# Patient Record
Sex: Female | Born: 1975 | Race: White | Hispanic: No | Marital: Married | Smoking: Current some day smoker
Health system: Southern US, Community
[De-identification: ages and names within clinical notes are randomized; demographics above are authoritative.]

## PROBLEM LIST (undated history)

## (undated) DIAGNOSIS — M1990 Unspecified osteoarthritis, unspecified site: Secondary | ICD-10-CM

## (undated) DIAGNOSIS — C801 Malignant (primary) neoplasm, unspecified: Secondary | ICD-10-CM

## (undated) DIAGNOSIS — E079 Disorder of thyroid, unspecified: Secondary | ICD-10-CM

---

## 1997-06-12 ENCOUNTER — Emergency Department (HOSPITAL_COMMUNITY): Admission: EM | Admit: 1997-06-12 | Discharge: 1997-06-12 | Payer: Self-pay | Admitting: Emergency Medicine

## 1997-08-05 ENCOUNTER — Encounter: Admission: RE | Admit: 1997-08-05 | Discharge: 1997-08-05 | Payer: Self-pay | Admitting: Family Medicine

## 1998-02-25 ENCOUNTER — Encounter: Admission: RE | Admit: 1998-02-25 | Discharge: 1998-02-25 | Payer: Self-pay | Admitting: Family Medicine

## 1998-03-02 ENCOUNTER — Encounter: Admission: RE | Admit: 1998-03-02 | Discharge: 1998-03-02 | Payer: Self-pay | Admitting: Family Medicine

## 1998-03-02 ENCOUNTER — Other Ambulatory Visit: Admission: RE | Admit: 1998-03-02 | Discharge: 1998-03-02 | Payer: Self-pay

## 1998-03-30 ENCOUNTER — Encounter: Admission: RE | Admit: 1998-03-30 | Discharge: 1998-03-30 | Payer: Self-pay | Admitting: Family Medicine

## 1998-04-02 ENCOUNTER — Ambulatory Visit (HOSPITAL_COMMUNITY): Admission: RE | Admit: 1998-04-02 | Discharge: 1998-04-02 | Payer: Self-pay

## 1998-04-13 ENCOUNTER — Encounter: Admission: RE | Admit: 1998-04-13 | Discharge: 1998-04-13 | Payer: Self-pay | Admitting: Family Medicine

## 1998-04-20 ENCOUNTER — Encounter: Admission: RE | Admit: 1998-04-20 | Discharge: 1998-04-20 | Payer: Self-pay | Admitting: Family Medicine

## 1998-05-21 ENCOUNTER — Encounter: Admission: RE | Admit: 1998-05-21 | Discharge: 1998-05-21 | Payer: Self-pay | Admitting: Family Medicine

## 1998-06-15 ENCOUNTER — Ambulatory Visit (HOSPITAL_COMMUNITY): Admission: RE | Admit: 1998-06-15 | Discharge: 1998-06-15 | Payer: Self-pay | Admitting: Obstetrics

## 1998-06-24 ENCOUNTER — Encounter: Admission: RE | Admit: 1998-06-24 | Discharge: 1998-06-24 | Payer: Self-pay | Admitting: Family Medicine

## 1998-07-15 ENCOUNTER — Encounter: Admission: RE | Admit: 1998-07-15 | Discharge: 1998-07-15 | Payer: Self-pay | Admitting: Family Medicine

## 1998-07-31 ENCOUNTER — Encounter: Admission: RE | Admit: 1998-07-31 | Discharge: 1998-07-31 | Payer: Self-pay | Admitting: Family Medicine

## 1998-08-17 ENCOUNTER — Encounter: Admission: RE | Admit: 1998-08-17 | Discharge: 1998-08-17 | Payer: Self-pay | Admitting: Family Medicine

## 1998-09-07 ENCOUNTER — Encounter: Admission: RE | Admit: 1998-09-07 | Discharge: 1998-09-07 | Payer: Self-pay | Admitting: Family Medicine

## 1998-09-08 ENCOUNTER — Ambulatory Visit (HOSPITAL_COMMUNITY): Admission: RE | Admit: 1998-09-08 | Discharge: 1998-09-08 | Payer: Self-pay

## 1998-09-18 ENCOUNTER — Encounter: Admission: RE | Admit: 1998-09-18 | Discharge: 1998-09-18 | Payer: Self-pay | Admitting: Family Medicine

## 1998-09-25 ENCOUNTER — Encounter: Admission: RE | Admit: 1998-09-25 | Discharge: 1998-09-25 | Payer: Self-pay | Admitting: Family Medicine

## 1998-10-02 ENCOUNTER — Encounter: Admission: RE | Admit: 1998-10-02 | Discharge: 1998-10-02 | Payer: Self-pay | Admitting: Family Medicine

## 1998-10-03 ENCOUNTER — Inpatient Hospital Stay (HOSPITAL_COMMUNITY): Admission: AD | Admit: 1998-10-03 | Discharge: 1998-10-05 | Payer: Self-pay | Admitting: Obstetrics

## 1998-10-13 ENCOUNTER — Encounter (INDEPENDENT_AMBULATORY_CARE_PROVIDER_SITE_OTHER): Payer: Self-pay

## 1998-10-13 ENCOUNTER — Observation Stay (HOSPITAL_COMMUNITY): Admission: EM | Admit: 1998-10-13 | Discharge: 1998-10-14 | Payer: Self-pay | Admitting: *Deleted

## 1998-10-13 ENCOUNTER — Encounter: Payer: Self-pay | Admitting: *Deleted

## 1998-10-13 ENCOUNTER — Encounter: Admission: RE | Admit: 1998-10-13 | Discharge: 1998-10-13 | Payer: Self-pay | Admitting: Sports Medicine

## 1998-10-20 ENCOUNTER — Encounter: Admission: RE | Admit: 1998-10-20 | Discharge: 1998-10-20 | Payer: Self-pay | Admitting: Sports Medicine

## 1998-11-04 ENCOUNTER — Encounter: Admission: RE | Admit: 1998-11-04 | Discharge: 1998-11-04 | Payer: Self-pay | Admitting: Family Medicine

## 1998-11-07 ENCOUNTER — Inpatient Hospital Stay (HOSPITAL_COMMUNITY): Admission: AD | Admit: 1998-11-07 | Discharge: 1998-11-07 | Payer: Self-pay | Admitting: Obstetrics

## 1998-11-19 ENCOUNTER — Encounter: Admission: RE | Admit: 1998-11-19 | Discharge: 1998-11-19 | Payer: Self-pay | Admitting: Family Medicine

## 1998-11-24 ENCOUNTER — Encounter: Admission: RE | Admit: 1998-11-24 | Discharge: 1998-11-24 | Payer: Self-pay

## 1998-12-15 ENCOUNTER — Encounter: Admission: RE | Admit: 1998-12-15 | Discharge: 1998-12-15 | Payer: Self-pay | Admitting: Sports Medicine

## 1999-01-04 ENCOUNTER — Encounter: Admission: RE | Admit: 1999-01-04 | Discharge: 1999-01-04 | Payer: Self-pay | Admitting: Family Medicine

## 1999-04-06 ENCOUNTER — Encounter: Admission: RE | Admit: 1999-04-06 | Discharge: 1999-04-06 | Payer: Self-pay | Admitting: Family Medicine

## 1999-07-05 ENCOUNTER — Encounter: Admission: RE | Admit: 1999-07-05 | Discharge: 1999-07-05 | Payer: Self-pay | Admitting: Family Medicine

## 1999-07-05 ENCOUNTER — Other Ambulatory Visit: Admission: RE | Admit: 1999-07-05 | Discharge: 1999-07-05 | Payer: Self-pay | Admitting: Obstetrics & Gynecology

## 2000-10-05 ENCOUNTER — Encounter: Admission: RE | Admit: 2000-10-05 | Discharge: 2000-10-05 | Payer: Self-pay | Admitting: Family Medicine

## 2000-11-30 ENCOUNTER — Encounter: Admission: RE | Admit: 2000-11-30 | Discharge: 2000-11-30 | Payer: Self-pay | Admitting: Family Medicine

## 2001-03-22 ENCOUNTER — Encounter: Admission: RE | Admit: 2001-03-22 | Discharge: 2001-03-22 | Payer: Self-pay | Admitting: Family Medicine

## 2001-04-05 ENCOUNTER — Encounter: Admission: RE | Admit: 2001-04-05 | Discharge: 2001-04-05 | Payer: Self-pay | Admitting: Family Medicine

## 2001-04-19 ENCOUNTER — Ambulatory Visit (HOSPITAL_COMMUNITY): Admission: RE | Admit: 2001-04-19 | Discharge: 2001-04-19 | Payer: Self-pay | Admitting: Family Medicine

## 2001-05-09 ENCOUNTER — Encounter: Admission: RE | Admit: 2001-05-09 | Discharge: 2001-05-09 | Payer: Self-pay | Admitting: Family Medicine

## 2001-05-11 ENCOUNTER — Encounter: Payer: Self-pay | Admitting: Sports Medicine

## 2001-05-11 ENCOUNTER — Encounter: Admission: RE | Admit: 2001-05-11 | Discharge: 2001-05-11 | Payer: Self-pay | Admitting: Sports Medicine

## 2001-06-05 ENCOUNTER — Encounter: Admission: RE | Admit: 2001-06-05 | Discharge: 2001-06-05 | Payer: Self-pay | Admitting: Family Medicine

## 2001-06-07 ENCOUNTER — Ambulatory Visit (HOSPITAL_COMMUNITY): Admission: RE | Admit: 2001-06-07 | Discharge: 2001-06-07 | Payer: Self-pay

## 2001-07-30 ENCOUNTER — Encounter: Admission: RE | Admit: 2001-07-30 | Discharge: 2001-07-30 | Payer: Self-pay | Admitting: Family Medicine

## 2001-08-16 ENCOUNTER — Encounter: Admission: RE | Admit: 2001-08-16 | Discharge: 2001-08-16 | Payer: Self-pay | Admitting: Family Medicine

## 2001-08-28 ENCOUNTER — Encounter: Admission: RE | Admit: 2001-08-28 | Discharge: 2001-08-28 | Payer: Self-pay | Admitting: Family Medicine

## 2001-09-12 ENCOUNTER — Encounter: Admission: RE | Admit: 2001-09-12 | Discharge: 2001-09-12 | Payer: Self-pay | Admitting: Family Medicine

## 2001-09-27 ENCOUNTER — Encounter: Admission: RE | Admit: 2001-09-27 | Discharge: 2001-09-27 | Payer: Self-pay | Admitting: Family Medicine

## 2003-03-08 ENCOUNTER — Emergency Department (HOSPITAL_COMMUNITY): Admission: EM | Admit: 2003-03-08 | Discharge: 2003-03-08 | Payer: Self-pay | Admitting: Emergency Medicine

## 2003-12-28 ENCOUNTER — Emergency Department: Payer: Self-pay | Admitting: Emergency Medicine

## 2004-11-14 ENCOUNTER — Encounter (INDEPENDENT_AMBULATORY_CARE_PROVIDER_SITE_OTHER): Payer: Self-pay | Admitting: *Deleted

## 2004-11-14 LAB — CONVERTED CEMR LAB

## 2004-12-14 ENCOUNTER — Ambulatory Visit: Payer: Self-pay | Admitting: Family Medicine

## 2004-12-14 ENCOUNTER — Other Ambulatory Visit: Admission: RE | Admit: 2004-12-14 | Discharge: 2004-12-14 | Payer: Self-pay | Admitting: Family Medicine

## 2005-01-12 ENCOUNTER — Ambulatory Visit: Payer: Self-pay | Admitting: Sports Medicine

## 2005-03-10 ENCOUNTER — Emergency Department: Payer: Self-pay | Admitting: Emergency Medicine

## 2006-03-21 ENCOUNTER — Encounter (INDEPENDENT_AMBULATORY_CARE_PROVIDER_SITE_OTHER): Payer: Self-pay | Admitting: Family Medicine

## 2006-03-21 ENCOUNTER — Ambulatory Visit: Payer: Self-pay | Admitting: Family Medicine

## 2006-03-21 LAB — CONVERTED CEMR LAB
ALT: 9 units/L (ref 0–35)
AST: 11 units/L (ref 0–37)
Albumin: 4.2 g/dL (ref 3.5–5.2)
Alkaline Phosphatase: 50 units/L (ref 39–117)
BUN: 8 mg/dL (ref 6–23)
CO2: 29 meq/L (ref 19–32)
Calcium: 9.3 mg/dL (ref 8.4–10.5)
Chloride: 109 meq/L (ref 96–112)
Creatinine, Ser: 0.68 mg/dL (ref 0.40–1.20)
Free T4: 1.02 ng/dL (ref 0.89–1.80)
Glucose, Bld: 70 mg/dL (ref 70–99)
Potassium: 4.3 meq/L (ref 3.5–5.3)
Sodium: 141 meq/L (ref 135–145)
TSH: 0.427 microintl units/mL (ref 0.350–5.50)
Total Bilirubin: 0.4 mg/dL (ref 0.3–1.2)
Total Protein: 6.6 g/dL (ref 6.0–8.3)

## 2006-04-13 DIAGNOSIS — F339 Major depressive disorder, recurrent, unspecified: Secondary | ICD-10-CM | POA: Insufficient documentation

## 2006-04-13 DIAGNOSIS — E669 Obesity, unspecified: Secondary | ICD-10-CM

## 2006-04-13 DIAGNOSIS — E6609 Other obesity due to excess calories: Secondary | ICD-10-CM | POA: Insufficient documentation

## 2006-04-13 DIAGNOSIS — E049 Nontoxic goiter, unspecified: Secondary | ICD-10-CM | POA: Insufficient documentation

## 2006-04-14 ENCOUNTER — Encounter (INDEPENDENT_AMBULATORY_CARE_PROVIDER_SITE_OTHER): Payer: Self-pay | Admitting: *Deleted

## 2006-09-18 ENCOUNTER — Ambulatory Visit: Payer: Self-pay | Admitting: Sports Medicine

## 2006-09-20 ENCOUNTER — Ambulatory Visit: Payer: Self-pay | Admitting: Family Medicine

## 2006-10-18 ENCOUNTER — Ambulatory Visit: Payer: Self-pay | Admitting: Family Medicine

## 2006-10-30 ENCOUNTER — Ambulatory Visit: Payer: Self-pay | Admitting: Sports Medicine

## 2006-10-30 DIAGNOSIS — F39 Unspecified mood [affective] disorder: Secondary | ICD-10-CM | POA: Insufficient documentation

## 2006-11-01 ENCOUNTER — Ambulatory Visit: Payer: Self-pay | Admitting: Family Medicine

## 2006-12-20 ENCOUNTER — Ambulatory Visit: Payer: Self-pay | Admitting: Family Medicine

## 2006-12-20 LAB — CONVERTED CEMR LAB: Blood Glucose, Fingerstick: 87

## 2007-03-06 ENCOUNTER — Encounter: Payer: Self-pay | Admitting: *Deleted

## 2007-05-21 ENCOUNTER — Ambulatory Visit: Payer: Self-pay | Admitting: Family Medicine

## 2007-05-21 ENCOUNTER — Encounter (INDEPENDENT_AMBULATORY_CARE_PROVIDER_SITE_OTHER): Payer: Self-pay | Admitting: Family Medicine

## 2007-05-21 ENCOUNTER — Other Ambulatory Visit: Admission: RE | Admit: 2007-05-21 | Discharge: 2007-05-21 | Payer: Self-pay | Admitting: Family Medicine

## 2007-05-21 DIAGNOSIS — F17200 Nicotine dependence, unspecified, uncomplicated: Secondary | ICD-10-CM | POA: Insufficient documentation

## 2007-05-21 DIAGNOSIS — K219 Gastro-esophageal reflux disease without esophagitis: Secondary | ICD-10-CM

## 2007-05-23 LAB — CONVERTED CEMR LAB: Pap Smear: NORMAL

## 2007-05-24 ENCOUNTER — Encounter (INDEPENDENT_AMBULATORY_CARE_PROVIDER_SITE_OTHER): Payer: Self-pay | Admitting: Family Medicine

## 2007-05-31 ENCOUNTER — Telehealth: Payer: Self-pay | Admitting: Psychology

## 2007-05-31 ENCOUNTER — Ambulatory Visit: Payer: Self-pay | Admitting: Family Medicine

## 2007-05-31 ENCOUNTER — Encounter (INDEPENDENT_AMBULATORY_CARE_PROVIDER_SITE_OTHER): Payer: Self-pay | Admitting: Family Medicine

## 2007-05-31 LAB — CONVERTED CEMR LAB
ALT: 35 units/L (ref 0–35)
AST: 24 units/L (ref 0–37)
Albumin: 4.7 g/dL (ref 3.5–5.2)
Alkaline Phosphatase: 81 units/L (ref 39–117)
BUN: 9 mg/dL (ref 6–23)
CO2: 24 meq/L (ref 19–32)
Calcium: 10 mg/dL (ref 8.4–10.5)
Chloride: 103 meq/L (ref 96–112)
Cholesterol: 207 mg/dL — ABNORMAL HIGH (ref 0–200)
Creatinine, Ser: 0.81 mg/dL (ref 0.40–1.20)
Glucose, Bld: 90 mg/dL (ref 70–99)
HCT: 45.8 % (ref 36.0–46.0)
HDL: 40 mg/dL (ref >39)
Hemoglobin: 15.6 g/dL — ABNORMAL HIGH (ref 12.0–15.0)
LDL Cholesterol: 146 mg/dL — ABNORMAL HIGH (ref 0–99)
MCHC: 34.1 g/dL (ref 30.0–36.0)
MCV: 98.9 fL (ref 78.0–100.0)
Platelets: 300 10*3/uL (ref 150–400)
Potassium: 4.3 meq/L (ref 3.5–5.3)
RBC: 4.63 M/uL (ref 3.87–5.11)
RDW: 14.5 % (ref 11.5–15.5)
Sodium: 139 meq/L (ref 135–145)
Total Bilirubin: 0.5 mg/dL (ref 0.3–1.2)
Total CHOL/HDL Ratio: 5.2
Total Protein: 7.8 g/dL (ref 6.0–8.3)
Triglycerides: 103 mg/dL (ref <150)
VLDL: 21 mg/dL (ref 0–40)
WBC: 9.4 10*3/uL (ref 4.0–10.5)

## 2007-06-04 ENCOUNTER — Encounter (INDEPENDENT_AMBULATORY_CARE_PROVIDER_SITE_OTHER): Payer: Self-pay | Admitting: Family Medicine

## 2007-06-13 ENCOUNTER — Ambulatory Visit: Payer: Self-pay | Admitting: Family Medicine

## 2007-07-11 ENCOUNTER — Ambulatory Visit: Payer: Self-pay | Admitting: Family Medicine

## 2007-07-25 ENCOUNTER — Ambulatory Visit: Payer: Self-pay | Admitting: Psychology

## 2007-08-22 ENCOUNTER — Encounter: Payer: Self-pay | Admitting: Psychology

## 2007-08-23 ENCOUNTER — Telehealth: Payer: Self-pay | Admitting: Psychology

## 2007-09-05 ENCOUNTER — Ambulatory Visit: Payer: Self-pay | Admitting: Family Medicine

## 2007-09-19 ENCOUNTER — Ambulatory Visit: Payer: Self-pay | Admitting: Psychology

## 2007-10-05 ENCOUNTER — Ambulatory Visit: Payer: Self-pay | Admitting: Psychology

## 2007-10-16 ENCOUNTER — Telehealth: Payer: Self-pay | Admitting: Psychology

## 2007-11-01 ENCOUNTER — Telehealth: Payer: Self-pay | Admitting: Psychology

## 2007-11-20 ENCOUNTER — Telehealth: Payer: Self-pay | Admitting: Psychology

## 2007-11-21 ENCOUNTER — Encounter: Payer: Self-pay | Admitting: Psychology

## 2008-04-14 ENCOUNTER — Telehealth: Payer: Self-pay | Admitting: Psychology

## 2008-04-15 ENCOUNTER — Telehealth: Payer: Self-pay | Admitting: Psychology

## 2008-05-06 ENCOUNTER — Encounter: Payer: Self-pay | Admitting: Family Medicine

## 2008-05-06 ENCOUNTER — Ambulatory Visit: Payer: Self-pay | Admitting: Family Medicine

## 2008-07-16 ENCOUNTER — Emergency Department: Payer: Self-pay | Admitting: Emergency Medicine

## 2010-11-01 ENCOUNTER — Emergency Department: Payer: Self-pay | Admitting: Emergency Medicine

## 2011-07-20 ENCOUNTER — Emergency Department: Payer: Self-pay | Admitting: *Deleted

## 2011-07-20 LAB — URINALYSIS, COMPLETE
Bilirubin,UR: NEGATIVE
Glucose,UR: NEGATIVE mg/dL (ref 0–75)
Ketone: NEGATIVE
Leukocyte Esterase: NEGATIVE
Nitrite: NEGATIVE
Ph: 6 (ref 4.5–8.0)
Protein: NEGATIVE
RBC,UR: 6 /HPF (ref 0–5)
Specific Gravity: 1.001 (ref 1.003–1.030)
Squamous Epithelial: <1
WBC UR: 1 /HPF (ref 0–5)

## 2011-07-20 LAB — PREGNANCY, URINE: Pregnancy Test, Urine: NEGATIVE m[IU]/mL

## 2011-10-15 ENCOUNTER — Emergency Department: Payer: Self-pay | Admitting: *Deleted

## 2013-01-15 ENCOUNTER — Emergency Department: Payer: Self-pay | Admitting: Emergency Medicine

## 2013-01-15 LAB — URINALYSIS, COMPLETE
Bilirubin,UR: NEGATIVE
Glucose,UR: NEGATIVE mg/dL (ref 0–75)
Ketone: NEGATIVE
Nitrite: NEGATIVE
Ph: 6 (ref 4.5–8.0)
Protein: NEGATIVE
RBC,UR: 5 /HPF (ref 0–5)
Specific Gravity: 1.01 (ref 1.003–1.030)
Squamous Epithelial: 4
WBC UR: 7 /HPF (ref 0–5)

## 2013-01-15 LAB — COMPREHENSIVE METABOLIC PANEL
Albumin: 3.7 g/dL (ref 3.4–5.0)
Alkaline Phosphatase: 61 U/L
Anion Gap: 5 — ABNORMAL LOW (ref 7–16)
BUN: 7 mg/dL (ref 7–18)
Bilirubin,Total: 0.6 mg/dL (ref 0.2–1.0)
Calcium, Total: 9 mg/dL (ref 8.5–10.1)
Chloride: 108 mmol/L — ABNORMAL HIGH (ref 98–107)
Co2: 25 mmol/L (ref 21–32)
Creatinine: 0.8 mg/dL (ref 0.60–1.30)
EGFR (African American): >60
EGFR (Non-African Amer.): >60
Glucose: 117 mg/dL — ABNORMAL HIGH (ref 65–99)
Osmolality: 275 (ref 275–301)
Potassium: 3.4 mmol/L — ABNORMAL LOW (ref 3.5–5.1)
SGOT(AST): 23 U/L (ref 15–37)
SGPT (ALT): 25 U/L (ref 12–78)
Sodium: 138 mmol/L (ref 136–145)
Total Protein: 7.2 g/dL (ref 6.4–8.2)

## 2013-01-15 LAB — CBC
HCT: 43.3 % (ref 35.0–47.0)
HGB: 14.7 g/dL (ref 12.0–16.0)
MCH: 33.7 pg (ref 26.0–34.0)
MCHC: 34 g/dL (ref 32.0–36.0)
MCV: 99 fL (ref 80–100)
Platelet: 229 10*3/uL (ref 150–440)
RBC: 4.38 10*6/uL (ref 3.80–5.20)
RDW: 14.2 % (ref 11.5–14.5)
WBC: 10.5 10*3/uL (ref 3.6–11.0)

## 2013-01-15 LAB — PREGNANCY, URINE: Pregnancy Test, Urine: NEGATIVE m[IU]/mL

## 2013-05-21 ENCOUNTER — Ambulatory Visit: Payer: Self-pay | Admitting: Family Medicine

## 2013-06-18 ENCOUNTER — Ambulatory Visit: Payer: Self-pay | Admitting: Family Medicine

## 2015-04-24 ENCOUNTER — Emergency Department
Admission: EM | Admit: 2015-04-24 | Discharge: 2015-04-24 | Disposition: A | Discharge status: Home / Self Care | Payer: Self-pay | Attending: Emergency Medicine | Admitting: Emergency Medicine

## 2015-04-24 DIAGNOSIS — J069 Acute upper respiratory infection, unspecified: Secondary | ICD-10-CM | POA: Insufficient documentation

## 2015-04-24 DIAGNOSIS — R350 Frequency of micturition: Secondary | ICD-10-CM | POA: Insufficient documentation

## 2015-04-24 LAB — URINALYSIS COMPLETE WITH MICROSCOPIC (ARMC ONLY)
Bacteria, UA: NONE SEEN
Bilirubin Urine: NEGATIVE
Glucose, UA: NEGATIVE mg/dL
Ketones, ur: NEGATIVE mg/dL
Leukocytes, UA: NEGATIVE
Nitrite: NEGATIVE
Protein, ur: NEGATIVE mg/dL
Specific Gravity, Urine: 1.001 — ABNORMAL LOW (ref 1.005–1.030)
pH: 6 (ref 5.0–8.0)

## 2015-04-24 LAB — POCT RAPID STREP A: Streptococcus, Group A Screen (Direct): NEGATIVE

## 2015-04-24 LAB — RAPID INFLUENZA A&B ANTIGENS (ARMC ONLY): INFLUENZA B (ARMC): NEGATIVE

## 2015-04-24 LAB — RAPID INFLUENZA A&B ANTIGENS: Influenza A (ARMC): NEGATIVE

## 2015-04-24 MED ORDER — FLUTICASONE PROPIONATE 50 MCG/ACT NA SUSP: 2.0000 | Freq: Every day | NASAL | Status: DC

## 2015-04-24 MED ORDER — AMOXICILLIN 500 MG PO CAPS: 500.0000 mg | ORAL_CAPSULE | Freq: Three times a day (TID) | ORAL | Status: DC

## 2015-04-24 MED ORDER — AZITHROMYCIN 250 MG PO TABS: ORAL_TABLET | ORAL | Status: DC

## 2015-04-24 MED ORDER — BENZONATATE 100 MG PO CAPS: 100.0000 mg | ORAL_CAPSULE | Freq: Three times a day (TID) | ORAL | Status: DC | PRN

## 2015-04-24 NOTE — ED Notes (Signed)
Pt states that she started with a sore throat 2 days ago, states that she has been running a fever of 102 at home, states that she has decreased energy, bodyaches, fever, and urinary frequency as well as sore throat

## 2015-04-24 NOTE — Discharge Instructions (Signed)
Cough, Adult A cough helps to clear your throat and lungs. A cough may last only 2-3 weeks (acute), or it may last longer than 8 weeks (chronic). Many different things can cause a cough. A cough may be a sign of an illness or another medical condition. HOME CARE  Pay attention to any changes in your cough.  Take medicines only as told by your doctor.  If you were prescribed an antibiotic medicine, take it as told by your doctor. Do not stop taking it even if you start to feel better.  Talk with your doctor before you try using a cough medicine.  Drink enough fluid to keep your pee (urine) clear or pale yellow.  If the air is dry, use a cold steam vaporizer or humidifier in your home.  Stay away from things that make you cough at work or at home.  If your cough is worse at night, try using extra pillows to raise your head up higher while you sleep.  Do not smoke, and try not to be around smoke. If you need help quitting, ask your doctor.  Do not have caffeine.  Do not drink alcohol.  Rest as needed. GET HELP IF:  You have new problems (symptoms).  You cough up yellow fluid (pus).  Your cough does not get better after 2-3 weeks, or your cough gets worse.  Medicine does not help your cough and you are not sleeping well.  You have pain that gets worse or pain that is not helped with medicine.  You have a fever.  You are losing weight and you do not know why.  You have night sweats. GET HELP RIGHT AWAY IF:  You cough up blood.  You have trouble breathing.  Your heartbeat is very fast.   This information is not intended to replace advice given to you by your health care provider. Make sure you discuss any questions you have with your health care provider.   Document Released: 10/14/2010 Document Revised: 10/22/2014 Document Reviewed: 04/09/2014 Elsevier Interactive Patient Education 2016 [employer]  Upper Respiratory Infection, Adult Most upper respiratory  infections (URIs) are a viral infection of the air passages leading to the lungs. A URI affects the nose, throat, and upper air passages. The most common type of URI is nasopharyngitis and is typically referred to as "the common cold." URIs run their course and usually go away on their own. Most of the time, a URI does not require medical attention, but sometimes a bacterial infection in the upper airways can follow a viral infection. This is called a secondary infection. Sinus and middle ear infections are common types of secondary upper respiratory infections. Bacterial pneumonia can also complicate a URI. A URI can worsen asthma and chronic obstructive pulmonary disease (COPD). Sometimes, these complications can require emergency medical care and may be life threatening.  CAUSES Almost all URIs are caused by viruses. A virus is a type of germ and can spread from one person to another.  RISKS FACTORS You may be at risk for a URI if:   You smoke.   You have chronic heart or lung disease.  You have a weakened defense (immune) system.   You are very young or very old.   You have nasal allergies or asthma.  You work in crowded or poorly ventilated areas.  You work in health care facilities or schools. SIGNS AND SYMPTOMS  Symptoms typically develop 2-3 days after you come in contact with a cold virus.  Most viral URIs last 7-10 days. However, viral URIs from the influenza virus (flu virus) can last 14-18 days and are typically more severe. Symptoms may include:   Runny or stuffy (congested) nose.   Sneezing.   Cough.   Sore throat.   Headache.   Fatigue.   Fever.   Loss of appetite.   Pain in your forehead, behind your eyes, and over your cheekbones (sinus pain).  Muscle aches.  DIAGNOSIS  Your health care provider may diagnose a URI by:  Physical exam.  Tests to check that your symptoms are not due to another condition such as:  Strep  throat.  Sinusitis.  Pneumonia.  Asthma. TREATMENT  A URI goes away on its own with time. It cannot be cured with medicines, but medicines may be prescribed or recommended to relieve symptoms. Medicines may help:  Reduce your fever.  Reduce your cough.  Relieve nasal congestion. HOME CARE INSTRUCTIONS   Take medicines only as directed by your health care provider.   Gargle warm saltwater or take cough drops to comfort your throat as directed by your health care provider.  Use a warm mist humidifier or inhale steam from a shower to increase air moisture. This may make it easier to breathe.  Drink enough fluid to keep your urine clear or pale yellow.   Eat soups and other clear broths and maintain good nutrition.   Rest as needed.   Return to work when your temperature has returned to normal or as your health care provider advises. You may need to stay home longer to avoid infecting others. You can also use a face mask and careful hand washing to prevent spread of the virus.  Increase the usage of your inhaler if you have asthma.   Do not use any tobacco products, including cigarettes, chewing tobacco, or electronic cigarettes. If you need help quitting, ask your health care provider. PREVENTION  The best way to protect yourself from getting a cold is to practice good hygiene.   Avoid oral or hand contact with people with cold symptoms.   Wash your hands often if contact occurs.  There is no clear evidence that vitamin C, vitamin E, echinacea, or exercise reduces the chance of developing a cold. However, it is always recommended to get plenty of rest, exercise, and practice good nutrition.  SEEK MEDICAL CARE IF:   You are getting worse rather than better.   Your symptoms are not controlled by medicine.   You have chills.  You have worsening shortness of breath.  You have brown or red mucus.  You have yellow or brown nasal discharge.  You have pain in your  face, especially when you bend forward.  You have a fever.  You have swollen neck glands.  You have pain while swallowing.  You have white areas in the back of your throat. SEEK IMMEDIATE MEDICAL CARE IF:   You have severe or persistent:  Headache.  Ear pain.  Sinus pain.  Chest pain.  You have chronic lung disease and any of the following:  Wheezing.  Prolonged cough.  Coughing up blood.  A change in your usual mucus.  You have a stiff neck.  You have changes in your:  Vision.  Hearing.  Thinking.  Mood. MAKE SURE YOU:   Understand these instructions.  Will watch your condition.  Will get help right away if you are not doing well or get worse.   This information is not intended to replace  advice given to you by your health care provider. Make sure you discuss any questions you have with your health care provider.   Document Released: 07/27/2000 Document Revised: 06/17/2014 Document Reviewed: 05/08/2013 Elsevier Interactive Patient Education 2016 [place] the prescription meds as directed. Follow-up with [facility] as needed for ongoing symptoms.

## 2015-04-24 NOTE — ED Provider Notes (Signed)
[facility] Emergency Department Provider Note ____________________________________________  Time seen: 1243  I have reviewed the triage vital signs and the nursing notes.  HISTORY  Chief Complaint  Sore Throat  HPI [name] is a 40 y.o. female presents to the ED for evaluation of a 2 day complaint of sore throat and fevers. She describes a Tmax at home of 102F. She also notes general fatigue, body aches, and some urinary frequency as well. She does admit to some sick contacts within her to work activities. She denies any nausea, vomiting, diarrhea. She did not receive flu vaccine this season. She's been dosing NyQuil and DayQuil, as well as Sudafed PE for symptom relief. She is also noticed some sinus drainage, cough, and sneezing in the interim.  No past medical history on file.  Patient Active Problem List   Diagnosis Date Noted  . TOBACCO ABUSE 05/21/2007  . GERD 05/21/2007  . 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 GOITER NOS 04/13/2006  . OBESITY, NOS 04/13/2006  . DEPRESSION, MAJOR, RECURRENT 04/13/2006    No past surgical history on file.  Current Outpatient Rx  Name  Route  Sig  Dispense  Refill  . azithromycin (ZITHROMAX Z-PAK) 250 MG tablet      Take 2 tablets (500 mg) on  Day 1,  followed by 1 tablet (250 mg) once daily on Days 2 through 5.   6 each   0   . benzonatate (TESSALON PERLES) 100 MG capsule   Oral   Take 1 capsule (100 mg total) by mouth 3 (three) times daily as needed for cough (Take 1-2 per dose).   30 capsule   0   . fluticasone (FLONASE) 50 MCG/ACT nasal spray   Each Nare   Place 2 sprays into both nostrils daily.   16 g   0    Allergies Review of patient's allergies indicates no known allergies.  No family history on file.  Social History Social History  Substance Use Topics  . Smoking status: Not on file  . Smokeless tobacco: Not on file  . Alcohol Use: Not on file   Review of  Systems  Constitutional: Positive for fever. Eyes: Negative for visual changes. ENT: Positive for sore throat. Cardiovascular: Negative for chest pain. Respiratory: Negative for shortness of breath. Gastrointestinal: Negative for abdominal pain, vomiting and diarrhea. Genitourinary: Negative for dysuria. Musculoskeletal: Negative for back pain. Skin: Negative for rash. Neurological: Negative for headaches, focal weakness or numbness. ____________________________________________  PHYSICAL EXAM:  VITAL SIGNS: ED Triage Vitals  Enc Vitals Group     BP 04/24/15 1131 120/79 mmHg     Pulse Rate 04/24/15 1131 86     Resp 04/24/15 1131 18     Temp 04/24/15 1131 98.6 F (37 C)     Temp Source 04/24/15 1131 Oral     SpO2 04/24/15 1131 98 %     Weight 04/24/15 1131 220 lb (99.791 kg)     Height 04/24/15 1131 5\' 11"  (1.803 m)     Head Cir --      Peak Flow --      Pain Score 04/24/15 1131 5     Pain Loc --      Pain Edu? --      Excl. in [place]? --    Constitutional: Alert and oriented. Well appearing and in no distress. Head: Normocephalic and atraumatic.      Eyes: Conjunctivae are normal. PERRL. Normal extraocular movements  Ears: Canals clear. TMs intact bilaterally.   Nose: No congestion/rhinorrhea.   Mouth/Throat: Mucous membranes are moist. Uvula is midline and tonsils are flat.   Neck: Supple. No thyromegaly. Hematological/Lymphatic/Immunological: No cervical lymphadenopathy. Cardiovascular: Normal rate, regular rhythm.  Respiratory: Normal respiratory effort. No wheezes/rales/rhonchi. Gastrointestinal: Soft and nontender. No distention. Musculoskeletal: Nontender with normal range of motion in all extremities.  Neurologic:  Normal gait without ataxia. Normal speech and language. No gross focal neurologic deficits are appreciated. Skin:  Skin is warm, dry and intact. No rash noted. Psychiatric: Mood and affect are normal. Patient exhibits appropriate insight and  judgment. ____________________________________________   LABS (pertinent positives/negatives) Labs Reviewed  URINALYSIS COMPLETEWITH MICROSCOPIC ([facility] ONLY) - Abnormal; Notable for the following:    Color, Urine COLORLESS (*)    APPearance CLEAR (*)    Specific Gravity, Urine 1.001 (*)    Hgb urine dipstick 3+ (*)    Squamous Epithelial / LPF 0-5 (*)    All other components within normal limits  RAPID INFLUENZA A&B ANTIGENS ([facility] ONLY)  POCT RAPID STREP A  ____________________________________________  INITIAL IMPRESSION / ASSESSMENT AND PLAN / ED COURSE  Patient with symptoms consistent with upper extremity infection that may include influenza. Her rapid test today was negative for both influenza and strep. She is discharged with instruction to continue those medications for symptom management. She'll continue to monitor and treat fevers as appropriate. She is advised to start over-the-counter allergy medicine + decongestant in the form of pseudoephedrine. She'll follow-up with primary care provider or return to the ED for acutely worsening symptoms. Work note is provided for today as requested. ____________________________________________  FINAL CLINICAL IMPRESSION(S) / ED DIAGNOSES  Final diagnoses:  URI (upper respiratory infection)      Melvenia Needles, PA-C 04/24/15 [place], MD 04/25/15 1506

## 2016-07-04 DIAGNOSIS — F419 Anxiety disorder, unspecified: Secondary | ICD-10-CM | POA: Insufficient documentation

## 2016-08-18 ENCOUNTER — Emergency Department
Admission: EM | Admit: 2016-08-18 | Discharge: 2016-08-18 | Disposition: A | Discharge status: Home / Self Care | Payer: Self-pay | Attending: Emergency Medicine | Admitting: Emergency Medicine

## 2016-08-18 ENCOUNTER — Encounter: Payer: Self-pay | Admitting: Emergency Medicine

## 2016-08-18 DIAGNOSIS — Z79899 Other long term (current) drug therapy: Secondary | ICD-10-CM | POA: Insufficient documentation

## 2016-08-18 DIAGNOSIS — M5442 Lumbago with sciatica, left side: Secondary | ICD-10-CM | POA: Insufficient documentation

## 2016-08-18 DIAGNOSIS — F17200 Nicotine dependence, unspecified, uncomplicated: Secondary | ICD-10-CM | POA: Insufficient documentation

## 2016-08-18 MED ORDER — ORPHENADRINE CITRATE 30 MG/ML IJ SOLN
60.0000 mg | Freq: Two times a day (BID) | INTRAMUSCULAR | Status: DC
  Administered 2016-08-18: 60 mg via INTRAMUSCULAR
  Filled 2016-08-18: qty 2

## 2016-08-18 MED ORDER — METHYLPREDNISOLONE SODIUM SUCC 125 MG IJ SOLR
125.0000 mg | Freq: Once | INTRAMUSCULAR | Status: AC
Start: 2016-08-18 — End: 2016-08-18
  Administered 2016-08-18: 125 mg via INTRAMUSCULAR
  Filled 2016-08-18: qty 2

## 2016-08-18 MED ORDER — PREDNISONE 50 MG PO TABS: ORAL_TABLET | ORAL | 0 refills | Status: DC

## 2016-08-18 NOTE — ED Triage Notes (Signed)
Pt ambulatory to triage with steady gait, no distress noted. Pt c/o lower back pain that radiates into left hip and left leg x2 weeks, worsening in last 2 days. 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

## 2016-08-18 NOTE — ED Notes (Signed)

## 2016-08-18 NOTE — ED Provider Notes (Signed)
[facility] Emergency Department Provider Note  ____________________________________________  Time seen: Approximately 9:09 PM  I have reviewed the triage vital signs and the nursing notes.   HISTORY  Chief Complaint No chief complaint on file.    HPI [name] is a 41 y.o. female presenting to the emergency department with 8 out of 10 low back pain with left lower extremity radiculopathy for the past 2 weeks. Patient denies falls or mechanisms of trauma. Patient states that she participates in heavy lifting at her place of work. She denies weakness or changes in sensation of the left lower extremity. Denies associated chest pain, chest tightness, nausea, vomiting or abdominal pain. No alleviating measures have been attempted.   History reviewed. No pertinent past medical history.  Patient Active Problem List   Diagnosis Date Noted  . TOBACCO ABUSE 05/21/2007  . GERD 05/21/2007  . 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 GOITER NOS 04/13/2006  . OBESITY, NOS 04/13/2006  . DEPRESSION, MAJOR, RECURRENT 04/13/2006    History reviewed. No pertinent surgical history.  Prior to Admission medications   Medication Sig Start Date End Date Taking? Authorizing Provider  azithromycin (ZITHROMAX Z-PAK) 250 MG tablet Take 2 tablets (500 mg) on  Day 1,  followed by 1 tablet (250 mg) once daily on Days 2 through 5. 04/24/15   Menshew, Dannielle Karvonen, PA-C  benzonatate (TESSALON PERLES) 100 MG capsule Take 1 capsule (100 mg total) by mouth 3 (three) times daily as needed for cough (Take 1-2 per dose). 04/24/15   Menshew, Dannielle Karvonen, PA-C  fluticasone (FLONASE) 50 MCG/ACT nasal spray Place 2 sprays into both nostrils daily. 04/24/15   Menshew, Dannielle Karvonen, PA-C  predniSONE (DELTASONE) 50 MG tablet Take one tablet by mouth daily for the next five days. 08/18/16   Lannie Fields, PA-C    Allergies Patient has no known allergies.  History reviewed. No pertinent  family history.  Social History Social History  Substance Use Topics  . Smoking status: Current Every Day Smoker  . Smokeless tobacco: Never Used  . Alcohol use No     Review of Systems  Constitutional: No fever/chills Eyes: No visual changes. No discharge ENT: No upper respiratory complaints. Cardiovascular: no chest pain. Respiratory: no cough. No SOB. Gastrointestinal: No abdominal pain.  No nausea, no vomiting.  No diarrhea.  No constipation. Musculoskeletal: Patient has low back pain. Skin: Negative for rash, abrasions, lacerations, ecchymosis. Neurological: Patient has left lower extremity radiculopathy.   ____________________________________________   PHYSICAL EXAM:  VITAL SIGNS: ED Triage Vitals  Enc Vitals Group     BP 08/18/16 2002 (!) 151/88     Pulse Rate 08/18/16 2002 91     Resp 08/18/16 2002 16     Temp 08/18/16 2002 97.8 F (36.6 C)     Temp Source 08/18/16 2002 Oral     SpO2 08/18/16 2002 100 %     Weight 08/18/16 2003 200 lb (90.7 kg)     Height --      Head Circumference --      Peak Flow --      Pain Score 08/18/16 2036 9     Pain Loc --      Pain Edu? --      Excl. in [place]? --      Constitutional: Alert and oriented. Well appearing and in no acute distress. Eyes: Conjunctivae are normal. PERRL. EOMI. Head: Atraumatic. Cardiovascular: Normal rate, regular rhythm. Normal S1 and  S2.  Good peripheral circulation. Respiratory: Normal respiratory effort without tachypnea or retractions. Lungs CTAB. Good air entry to the bases with no decreased or absent breath sounds. Musculoskeletal: Patient has 5/5 strength in the upper and lower extremities bilaterally. Full range of motion at the shoulder, elbow and wrist bilaterally. Full range of motion at the hip, knee and ankle bilaterally. Positive straight leg raise test, left. Palpable dorsalis pedis pulses bilaterally and symmetrically. No changes in gait.   Neurologic:  Normal speech and language. No  gross focal neurologic deficits are appreciated.  Skin:  Skin is warm, dry and intact. No rash noted. Psychiatric: Mood and affect are normal. Speech and behavior are normal. Patient exhibits appropriate insight and judgement.   ____________________________________________   LABS (all labs ordered are listed, but only abnormal results are displayed)  Labs Reviewed - No data to display ____________________________________________  EKG   ____________________________________________  RADIOLOGY  No results found.  ____________________________________________    PROCEDURES  Procedure(s) performed:    Procedures    Medications  orphenadrine (NORFLEX) injection 60 mg (60 mg Intramuscular Given 08/18/16 2123)  methylPREDNISolone sodium succinate (SOLU-MEDROL) 125 mg/2 mL injection 125 mg (125 mg Intramuscular Given 08/18/16 2123)     ____________________________________________   INITIAL IMPRESSION / ASSESSMENT AND PLAN / ED COURSE  Pertinent labs & imaging results that were available during my care of the patient were reviewed by me and considered in my medical decision making (see chart for details).  Review of the [place] CSRS was performed in accordance of the [place] prior to dispensing any controlled drugs.     Assessment and plan: Low back pain: Patient presents to the emergency department with low back pain with left lower extremity radiculopathy for the past 2 weeks. Neurologic examination and overall physical exam are reassuring. Patient was given an injection of Norflex and Solu-Medrol in the emergency department. She was discharged with prednisone. She was advised to follow-up with primary care as needed. Vital signs are reassuring. All patient questions were answered.   ____________________________________________  FINAL CLINICAL IMPRESSION(S) / ED DIAGNOSES  Final diagnoses:  Acute bilateral low back pain with left-sided sciatica      NEW MEDICATIONS  STARTED DURING THIS VISIT:  New Prescriptions   PREDNISONE (DELTASONE) 50 MG TABLET    Take one tablet by mouth daily for the next five days.        This chart was dictated using voice recognition software/Dragon. Despite best efforts to proofread, errors can occur which can change the meaning. Any change was purely unintentional.    Karren Cobble 08/18/16 2133    Merlyn Lot, MD 08/19/16 [phone]

## 2016-10-04 DIAGNOSIS — F3175 Bipolar disorder, in partial remission, most recent episode depressed: Secondary | ICD-10-CM | POA: Insufficient documentation

## 2017-08-14 DIAGNOSIS — C50211 Malignant neoplasm of upper-inner quadrant of right female breast: Secondary | ICD-10-CM | POA: Insufficient documentation

## 2017-10-03 DIAGNOSIS — G629 Polyneuropathy, unspecified: Secondary | ICD-10-CM | POA: Insufficient documentation

## 2017-10-03 DIAGNOSIS — M5410 Radiculopathy, site unspecified: Secondary | ICD-10-CM | POA: Insufficient documentation

## 2017-11-17 ENCOUNTER — Encounter: Payer: Self-pay | Admitting: Emergency Medicine

## 2017-11-17 ENCOUNTER — Emergency Department
Admission: EM | Admit: 2017-11-17 | Discharge: 2017-11-17 | Disposition: A | Discharge status: Home / Self Care | Payer: PRIVATE HEALTH INSURANCE | Attending: Emergency Medicine | Admitting: Emergency Medicine

## 2017-11-17 ENCOUNTER — Other Ambulatory Visit: Payer: Self-pay

## 2017-11-17 DIAGNOSIS — R52 Pain, unspecified: Secondary | ICD-10-CM | POA: Diagnosis not present

## 2017-11-17 DIAGNOSIS — R531 Weakness: Secondary | ICD-10-CM | POA: Diagnosis present

## 2017-11-17 DIAGNOSIS — Z853 Personal history of malignant neoplasm of breast: Secondary | ICD-10-CM | POA: Diagnosis not present

## 2017-11-17 HISTORY — DX: Unspecified osteoarthritis, unspecified site: M19.90

## 2017-11-17 HISTORY — DX: Disorder of thyroid, unspecified: E07.9

## 2017-11-17 HISTORY — DX: Malignant (primary) neoplasm, unspecified: C80.1

## 2017-11-17 LAB — BASIC METABOLIC PANEL
Anion gap: 10 (ref 5–15)
BUN: 10 mg/dL (ref 6–20)
CO2: 24 mmol/L (ref 22–32)
Calcium: 9.4 mg/dL (ref 8.9–10.3)
Chloride: 103 mmol/L (ref 98–111)
Creatinine, Ser: 0.4 mg/dL — ABNORMAL LOW (ref 0.44–1.00)
GFR calc Af Amer: >60 mL/min (ref >60)
GFR calc non Af Amer: >60 mL/min (ref >60)
Glucose, Bld: 127 mg/dL — ABNORMAL HIGH (ref 70–99)
Potassium: 3.5 mmol/L (ref 3.5–5.1)
Sodium: 137 mmol/L (ref 135–145)

## 2017-11-17 LAB — CBC WITH DIFFERENTIAL/PLATELET
Basophils Absolute: 0 10*3/uL (ref 0–0.1)
Basophils Relative: 1 %
Eosinophils Absolute: 0 10*3/uL (ref 0–0.7)
Eosinophils Relative: 1 %
HCT: 30.9 % — ABNORMAL LOW (ref 35.0–47.0)
Hemoglobin: 11 g/dL — ABNORMAL LOW (ref 12.0–16.0)
Lymphocytes Relative: 15 %
Lymphs Abs: 0.4 10*3/uL — ABNORMAL LOW (ref 1.0–3.6)
MCH: 36.3 pg — ABNORMAL HIGH (ref 26.0–34.0)
MCHC: 35.7 g/dL (ref 32.0–36.0)
MCV: 101.6 fL — ABNORMAL HIGH (ref 80.0–100.0)
Monocytes Absolute: 0.1 10*3/uL — ABNORMAL LOW (ref 0.2–0.9)
Monocytes Relative: 4 %
Neutro Abs: 2.2 10*3/uL (ref 1.4–6.5)
Neutrophils Relative %: 79 %
Platelets: 237 10*3/uL (ref 150–440)
RBC: 3.04 MIL/uL — ABNORMAL LOW (ref 3.80–5.20)
RDW: 19.4 % — ABNORMAL HIGH (ref 11.5–14.5)
WBC: 2.7 10*3/uL — ABNORMAL LOW (ref 3.6–11.0)

## 2017-11-17 MED ORDER — FENTANYL CITRATE (PF) 100 MCG/2ML IJ SOLN
75.0000 ug | Freq: Once | INTRAMUSCULAR | Status: AC
  Administered 2017-11-17: 75 ug via INTRAVENOUS
  Filled 2017-11-17: qty 2

## 2017-11-17 MED ORDER — SODIUM CHLORIDE 0.9 % IV BOLUS
1000.0000 mL | Freq: Once | INTRAVENOUS | Status: AC
  Administered 2017-11-17: 1000 mL via INTRAVENOUS

## 2017-11-17 MED ORDER — MORPHINE SULFATE (PF) 4 MG/ML IV SOLN
4.0000 mg | Freq: Once | INTRAVENOUS | Status: AC
  Administered 2017-11-17: 4 mg via INTRAVENOUS

## 2017-11-17 MED ORDER — HEPARIN SOD (PORK) LOCK FLUSH 100 UNIT/ML IV SOLN
INTRAVENOUS | Status: AC
  Administered 2017-11-17: 19:00:00
  Filled 2017-11-17: qty 5

## 2017-11-17 MED ORDER — MORPHINE SULFATE (PF) 4 MG/ML IV SOLN
INTRAVENOUS | Status: AC
  Administered 2017-11-17: 4 mg via INTRAVENOUS
  Filled 2017-11-17: qty 1

## 2017-11-17 NOTE — ED Triage Notes (Signed)
Pt arrived via POV with reports of shakes and generalized body aches, pt had 3rd treatment of Taxol and states her oncologist told her to come to the nearest hospital.  Pt currently being treated for breast cancer.   Pt anxious in triage and short of breath.

## 2017-11-17 NOTE — ED Notes (Signed)
Pt ambulatory to toilet by self.  

## 2017-11-17 NOTE — ED Notes (Addendum)
Pt alert, oriented, appears uncomfortable. R breast CA that is being treated at [facility]. L chest port accessed by this RN at this time. Pt states received 3rd round of Taxol on Tuesday, hasn't felt well since. States last night pain all over body. Nausea. No vomiting. No appetite. States diarrhea today. No fevers. States CA doctor told her could be nutropenia. Pt states shaking from pain, not cold. Took a zofran at home. Has oxycodone at home but hasn't taken any today. States "I feel like I've been hit by a bus."

## 2017-11-17 NOTE — ED Notes (Signed)
Dr. Goodman at bedside.  

## 2017-11-17 NOTE — Discharge Instructions (Addendum)
Please seek medical attention for any high fevers, chest pain, shortness of breath, change in behavior, persistent vomiting, bloody stool or any other new or concerning symptoms.  

## 2017-11-17 NOTE — ED Provider Notes (Signed)
[facility] Emergency Department Provider Note    ____________________________________________   I have reviewed the triage vital signs and the nursing notes.   HISTORY  Chief Complaint Shaking and Generalized Body Aches   History limited by: Not Limited   HPI [name] is a 42 y.o. female who presents to the emergency department today because of concerns for weakness and feeling "like I got hit by a bus".  Patient states she started feeling bad yesterday to continue throughout the night and today.  She complains of pain everywhere.  She feels sick.  This has been accompanied by shaking.  Patient has been checking her temperature at home and is not had any fevers.  Patient did undergo a round of chemotherapy earlier this week.  She states this is now her third round.  She has never felt like this after her chemotherapy.  She denies any vomiting or diarrhea.  Denies any urinary complaints.  No shortness of breath.   Per medical record review patient has a history of breast cancer undergoing chemotherapy.  Past Medical History:  Diagnosis Date  . Cancer (HCC)    Breast  . DJD (degenerative joint disease)   . Thyroid disease     Patient Active Problem List   Diagnosis Date Noted  . TOBACCO ABUSE 05/21/2007  . GERD 05/21/2007  . 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 GOITER NOS 04/13/2006  . OBESITY, NOS 04/13/2006  . DEPRESSION, MAJOR, RECURRENT 04/13/2006    History reviewed. No pertinent surgical history.  Prior to Admission medications   Medication Sig Start Date End Date Taking? Authorizing Provider  azithromycin (ZITHROMAX Z-PAK) 250 MG tablet Take 2 tablets (500 mg) on  Day 1,  followed by 1 tablet (250 mg) once daily on Days 2 through 5. 04/24/15   Menshew, Dannielle Karvonen, PA-C  benzonatate (TESSALON PERLES) 100 MG capsule Take 1 capsule (100 mg total) by mouth 3 (three) times daily as needed for cough (Take 1-2 per dose). 04/24/15    Menshew, Dannielle Karvonen, PA-C  fluticasone (FLONASE) 50 MCG/ACT nasal spray Place 2 sprays into both nostrils daily. 04/24/15   Menshew, Dannielle Karvonen, PA-C  predniSONE (DELTASONE) 50 MG tablet Take one tablet by mouth daily for the next five days. 08/18/16   Lannie Fields, PA-C  clonazePAM (KLONOPIN) 2 MG tablet Take 2 mg by mouth 2 (two) times daily.    04/24/15  [provider]  lamoTRIgine (LAMICTAL) 200 MG tablet 1/2 tab daily for one week then take one tab daily thereafter   04/24/15  [provider]  omeprazole (PRILOSEC OTC) 20 MG tablet Take 20 mg by mouth daily.    04/24/15  [provider]  QUEtiapine (SEROQUEL) 300 MG tablet 2 tab by mouth at bedtime per [place]/Manning   04/24/15  [provider]    Allergies Buspirone  History reviewed. No pertinent family history.  Social History Social History   Tobacco Use  . Smoking status: Former Research scientist (life sciences)  . Smokeless tobacco: Never Used  Substance Use Topics  . Alcohol use: No  . Drug use: No    Review of Systems Constitutional: No fever Eyes: No visual changes. ENT: No sore throat. Cardiovascular: Positive chest pain. Respiratory: Denies shortness of breath. Gastrointestinal: Positive abdominal pain.  No nausea, no vomiting.  No diarrhea.   Genitourinary: Negative for dysuria. Musculoskeletal: Positive for generalized body aches.  Skin: Negative for rash. Neurological: Negative for headaches, focal weakness or numbness.  ____________________________________________   PHYSICAL EXAM:  VITAL SIGNS: ED Triage Vitals  Enc Vitals Group     BP 11/17/17 1532 (!) 139/101     Pulse Rate 11/17/17 1532 85     Resp 11/17/17 1532 (!) 26     Temp 11/17/17 1532 97.9 F (36.6 C)     Temp Source 11/17/17 1532 Oral     SpO2 11/17/17 1532 98 %     Weight 11/17/17 1533 240 lb (108.9 kg)     Height 11/17/17 1533 5\' 9"  (1.753 m)   Constitutional: Alert and oriented.  Eyes: Conjunctivae are normal.   ENT      Head: Normocephalic and atraumatic.      Nose: No congestion/rhinnorhea.      Mouth/Throat: Mucous membranes are moist.      Neck: No stridor. Hematological/Lymphatic/Immunilogical: No cervical lymphadenopathy. Cardiovascular: Normal rate, regular rhythm.  No murmurs, rubs, or gallops.  Respiratory: Normal respiratory effort without tachypnea nor retractions. Breath sounds are clear and equal bilaterally. No wheezes/rales/rhonchi. Gastrointestinal: Soft and non tender. No rebound. No guarding.  Genitourinary: Deferred Musculoskeletal: Normal range of motion in all extremities. No lower extremity edema. Neurologic:  Normal speech and language. No gross focal neurologic deficits are appreciated.  Skin:  Skin is warm, dry and intact. No rash noted. Psychiatric: Mood and affect are normal. Speech and behavior are normal. Patient exhibits appropriate insight and judgment.  ____________________________________________    LABS (pertinent positives/negatives)  BMP na 137, k 3.5, glu 127, cr 0.40 CBC wbc 2.7, hgb 11.0, plt 237 ____________________________________________   EKG  None  ____________________________________________    RADIOLOGY  None  ____________________________________________   PROCEDURES  Procedures  ____________________________________________   INITIAL IMPRESSION / ASSESSMENT AND PLAN / ED COURSE  Pertinent labs & imaging results that were available during my care of the patient were reviewed by me and considered in my medical decision making (see chart for details).   Patient presented to the emergency department today because of concerns for pain.  She is also had some shaking.  Patient was afebrile here and has not had any fevers at home.  Patient's blood work does show slightly decreased white blood cell count however patient is not neutropenic.  Patient did feel better after IV fluids and medication.  At this point think likely reaction to  chemotherapy.  Discussed with patient importance of taking her home pain medications.  Discussed importance of oncology follow-up and return precautions for neutropenic fever.   ____________________________________________   FINAL CLINICAL IMPRESSION(S) / ED DIAGNOSES  Final diagnoses:  Diffuse pain     Note: This dictation was prepared with Dragon dictation. Any transcriptional errors that result from this process are unintentional     Nance Pear, MD 11/17/17 1858

## 2018-01-03 ENCOUNTER — Other Ambulatory Visit: Payer: Self-pay

## 2018-01-03 ENCOUNTER — Encounter: Payer: Self-pay | Admitting: Emergency Medicine

## 2018-01-03 ENCOUNTER — Emergency Department
Admission: EM | Admit: 2018-01-03 | Discharge: 2018-01-04 | Disposition: A | Discharge status: Home / Self Care | Payer: PRIVATE HEALTH INSURANCE | Attending: Emergency Medicine | Admitting: Emergency Medicine

## 2018-01-03 ENCOUNTER — Emergency Department: Payer: PRIVATE HEALTH INSURANCE

## 2018-01-03 DIAGNOSIS — R509 Fever, unspecified: Secondary | ICD-10-CM | POA: Diagnosis not present

## 2018-01-03 DIAGNOSIS — Z9221 Personal history of antineoplastic chemotherapy: Secondary | ICD-10-CM | POA: Insufficient documentation

## 2018-01-03 DIAGNOSIS — E079 Disorder of thyroid, unspecified: Secondary | ICD-10-CM | POA: Diagnosis not present

## 2018-01-03 DIAGNOSIS — Z87891 Personal history of nicotine dependence: Secondary | ICD-10-CM | POA: Insufficient documentation

## 2018-01-03 DIAGNOSIS — R8281 Pyuria: Secondary | ICD-10-CM | POA: Diagnosis not present

## 2018-01-03 DIAGNOSIS — Z853 Personal history of malignant neoplasm of breast: Secondary | ICD-10-CM | POA: Insufficient documentation

## 2018-01-03 LAB — COMPREHENSIVE METABOLIC PANEL
ALBUMIN: 3.7 g/dL (ref 3.5–5.0)
ALT: 31 U/L (ref 0–44)
AST: 23 U/L (ref 15–41)
Alkaline Phosphatase: 76 U/L (ref 38–126)
Anion gap: 8 (ref 5–15)
BUN: 9 mg/dL (ref 6–20)
CHLORIDE: 105 mmol/L (ref 98–111)
CO2: 25 mmol/L (ref 22–32)
CREATININE: 0.64 mg/dL (ref 0.44–1.00)
Calcium: 9.1 mg/dL (ref 8.9–10.3)
GFR calc Af Amer: >60 mL/min (ref >60)
GLUCOSE: 104 mg/dL — AB (ref 70–99)
Potassium: 3.6 mmol/L (ref 3.5–5.1)
SODIUM: 138 mmol/L (ref 135–145)
Total Bilirubin: 0.5 mg/dL (ref 0.3–1.2)
Total Protein: 6.4 g/dL — ABNORMAL LOW (ref 6.5–8.1)

## 2018-01-03 LAB — POCT PREGNANCY, URINE: PREG TEST UR: NEGATIVE

## 2018-01-03 LAB — URINALYSIS, ROUTINE W REFLEX MICROSCOPIC
BILIRUBIN URINE: NEGATIVE
Glucose, UA: NEGATIVE mg/dL
Hgb urine dipstick: NEGATIVE
KETONES UR: NEGATIVE mg/dL
Nitrite: NEGATIVE
PROTEIN: NEGATIVE mg/dL
Specific Gravity, Urine: 1.02 (ref 1.005–1.030)
pH: 5 (ref 5.0–8.0)

## 2018-01-03 LAB — LACTIC ACID, PLASMA: Lactic Acid, Venous: 1.9 mmol/L (ref 0.5–1.9)

## 2018-01-03 LAB — CG4 I-STAT (LACTIC ACID): Lactic Acid, Venous: 1.72 mmol/L (ref 0.5–1.9)

## 2018-01-03 MED ORDER — SODIUM CHLORIDE 0.9 % IV SOLN
2.0000 g | Freq: Once | INTRAVENOUS | Status: AC
  Administered 2018-01-04: 2 g via INTRAVENOUS
  Filled 2018-01-03: qty 2

## 2018-01-03 NOTE — ED Provider Notes (Signed)
[facility] Emergency Department Provider Note  Time seen: 10:36 PM  I have reviewed the triage vital signs and the nursing notes.   HISTORY  Chief Complaint Fever    HPI [name] is a 42 y.o. female with a past medical history of breast cancer currently on chemotherapy presents to the emergency department for a fever to 100.8 at home.  According to the patient she received chemotherapy 8 days ago, was supposed to receive chemotherapy yesterday however could not do so because her white blood cell count was too low.  Patient checks her temperature intermittently and states today it reached 100.8 so she came to the emergency department for evaluation as recommended by her oncology team at [facility].  Patient denies any symptoms however, denies any cough, congestion, abdominal pain, chest pain, nausea, vomiting, diarrhea, dysuria or hematuria.  Negative review of systems.   Past Medical History:  Diagnosis Date  . Cancer (HCC)    Breast  . DJD (degenerative joint disease)   . Thyroid disease     Patient Active Problem List   Diagnosis Date Noted  . TOBACCO ABUSE 05/21/2007  . GERD 05/21/2007  . 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 GOITER NOS 04/13/2006  . OBESITY, NOS 04/13/2006  . DEPRESSION, MAJOR, RECURRENT 04/13/2006    History reviewed. No pertinent surgical history.  Prior to Admission medications   Medication Sig Start Date End Date Taking? Authorizing Provider  azithromycin (ZITHROMAX Z-PAK) 250 MG tablet Take 2 tablets (500 mg) on  Day 1,  followed by 1 tablet (250 mg) once daily on Days 2 through 5. 04/24/15   Menshew, Dannielle Karvonen, PA-C  benzonatate (TESSALON PERLES) 100 MG capsule Take 1 capsule (100 mg total) by mouth 3 (three) times daily as needed for cough (Take 1-2 per dose). 04/24/15   Menshew, Dannielle Karvonen, PA-C  fluticasone (FLONASE) 50 MCG/ACT nasal spray Place 2 sprays into both nostrils daily. 04/24/15   Menshew, Dannielle Karvonen, PA-C  predniSONE (DELTASONE) 50 MG tablet Take one tablet by mouth daily for the next five days. 08/18/16   Lannie Fields, PA-C    Allergies  Allergen Reactions  . Buspirone Other (See Comments)    Depressed mood and tearful     No family history on file.  Social History Social History   Tobacco Use  . Smoking status: Former Research scientist (life sciences)  . Smokeless tobacco: Never Used  Substance Use Topics  . Alcohol use: No  . Drug use: No    Review of Systems Constitutional: Fever to 100.8 at home. ENT: Negative for recent illness/congestion.  Negative for sore throat. Cardiovascular: Negative for chest pain. Respiratory: Negative for shortness of breath.  Negative for cough. Gastrointestinal: Negative for abdominal pain, vomiting and diarrhea. Genitourinary: Negative for urinary compaints Musculoskeletal: Negative for musculoskeletal complaints Skin: Negative for skin complaints  Neurological: Negative for headache All other ROS negative  ____________________________________________   PHYSICAL EXAM:  VITAL SIGNS: ED Triage Vitals  Enc Vitals Group     BP 01/03/18 2226 124/71     Pulse Rate 01/03/18 2226 (!) 105     Resp 01/03/18 2226 18     Temp 01/03/18 2226 98.4 F (36.9 C)     Temp Source 01/03/18 2226 Oral     SpO2 01/03/18 2226 98 %     Weight --      Height --      Head Circumference --      Peak  Flow --      Pain Score 01/03/18 2229 6     Pain Loc --      Pain Edu? --      Excl. in [place]? --    Constitutional: Alert and oriented. Well appearing and in no distress. Eyes: Normal exam ENT   Head: Normocephalic and atraumatic   Mouth/Throat: Mucous membranes are moist. Cardiovascular: Normal rate, regular rhythm around 100 bpm. Respiratory: Normal respiratory effort without tachypnea nor retractions. Breath sounds are clear Gastrointestinal: Soft and nontender. No distention.   Musculoskeletal: Nontender with normal range of motion in all extremities. No  lower extremity tenderness or edema. Neurologic:  Normal speech and language. No gross focal neurologic deficits Skin:  Skin is warm, dry and intact.  Psychiatric: Mood and affect are normal. Speech and behavior are normal.   ____________________________________________   RADIOLOGY  Chest x-ray is negative  ____________________________________________   INITIAL IMPRESSION / ASSESSMENT AND PLAN / ED COURSE  Pertinent labs & imaging results that were available during my care of the patient were reviewed by me and considered in my medical decision making (see chart for details).  Patient presents to the emergency department with a reported fever of 100.8 at home.  Afebrile currently in the emergency department.  Concern at this time would be for neutropenic fever.  Differential be quite broad including infectious etiology such as pneumonia, urinary tract infection.  We will check labs including blood cultures, urine and urine culture.  Will obtain a chest x-ray and continue to closely monitor.  Once work-up is resulted we will likely discuss with oncology for further recommendations.  I was able to review the patient's lab work from yesterday 01/02/2018 on care everywhere at that time the patient had a white blood cell count of 1.9 with a neutrophil count of 0.8.  Patient states she received an injection to increase her white blood cell count yesterday (likely Neupogen).  We will recheck labs today.  Patient appears to follow-up with Dr. Hazle Nordmann at [facility] for oncology.  Chest x-ray is negative.  Labs are pending.  Patient care signed out to oncoming physician. ____________________________________________   FINAL CLINICAL IMPRESSION(S) / ED DIAGNOSES  Fever    Harvest Dark, MD 01/03/18 2303

## 2018-01-03 NOTE — ED Provider Notes (Signed)
-----------------------------------------   11:16 PM on 01/03/2018 -----------------------------------------   Assuming care from Dr. Kerman Passey.  In short, [name] is a 42 y.o. female with a chief complaint of fever in the setting of chemotherapy and probable neutropenia.  Refer to the original H&P for additional details.  The current plan of care is to follow up labs, talk to oncology to confirm plan for admission.  Patient receiving cefepime 2 g IV.   ----------------------------------------- 12:56 AM on 01/04/2018 -----------------------------------------  Labs are notable for a very mild leukocytosis of 11.1, and generally reassuring CBC with differential including an ANC that appears to calculate out at about 7992.  This is a dramatic improvement from 1 to 2 days ago after she got her injection at [facility] to boost her blood counts.  We have been unable to get a hold of Dr. Grayland Ormond to discuss the case so I talked with the patient about what she would like to do.  I explained my recommendation that she should be admitted for continued IV antibiotics until we can verify that her cultures are negative given her fever in the setting of neutropenia even though the neutropenia has resolved.  She said that she really needs to go home to help take care of her family and she promises to call her oncologist first thing in the morning.  It is currently about 1 AM and she is asymptomatic and no longer febrile.  Given the circumstances I have asked her to sign an AMA form, but she is very reasonable, not upset, and simply explained that she has 2 disabled children at home and her husband has to work so she needs to be home.  However she promises to call her doctor and to call 911 or return to the [facility] emergency department should she have any worsening symptoms.  She is hemodynamically stable with a heart rate in the 80s and afebrile and asymptomatic.  I think it is appropriate for her to follow-up even  though she understands that if she does have an infection it could be serious or even life-threatening.  She did not feel it was necessary for me to call [facility] oncology to discuss though I did offer to do so.  She will be discharged with strict return precautions.   Hinda Kehr, MD 01/04/18 [phone]

## 2018-01-03 NOTE — ED Triage Notes (Signed)
Pt here with c/o fever at 100.8 at home, is being treated for breast cancer, WBC's too low to receive chemo yesterday, states she was told to come in for fever over 100.4. She took 200mg  of ibuprofen around 2000 tonight. Has degenerative disc pain and stays at a 6/10 when her pain is "controlled." This is her second round of chemo, denies cough or congestion, appears in NAD.

## 2018-01-04 LAB — CBC WITH DIFFERENTIAL/PLATELET
Abs Immature Granulocytes: 0.38 10*3/uL — ABNORMAL HIGH (ref 0.00–0.07)
BASOS PCT: 1 %
Basophils Absolute: 0.1 10*3/uL (ref 0.0–0.1)
Eosinophils Absolute: 0.1 10*3/uL (ref 0.0–0.5)
Eosinophils Relative: 1 %
HCT: 29.9 % — ABNORMAL LOW (ref 36.0–46.0)
HEMOGLOBIN: 9.8 g/dL — AB (ref 12.0–15.0)
Immature Granulocytes: 3 %
Lymphocytes Relative: 14 %
Lymphs Abs: 1.6 10*3/uL (ref 0.7–4.0)
MCH: 35.5 pg — ABNORMAL HIGH (ref 26.0–34.0)
MCHC: 32.8 g/dL (ref 30.0–36.0)
MCV: 108.3 fL — AB (ref 80.0–100.0)
MONO ABS: 1 10*3/uL (ref 0.1–1.0)
Monocytes Relative: 9 %
NEUTROS ABS: 7.9 10*3/uL — AB (ref 1.7–7.7)
NEUTROS PCT: 72 %
PLATELETS: 225 10*3/uL (ref 150–400)
RBC: 2.76 MIL/uL — AB (ref 3.87–5.11)
RDW: 13.9 % (ref 11.5–15.5)
SMEAR REVIEW: NORMAL
WBC: 11.1 10*3/uL — AB (ref 4.0–10.5)
nRBC: 1.5 % — ABNORMAL HIGH (ref 0.0–0.2)

## 2018-01-04 MED ORDER — HEPARIN SOD (PORK) LOCK FLUSH 100 UNIT/ML IV SOLN
INTRAVENOUS | Status: AC
  Administered 2018-01-04: 500 [IU] via INTRAVENOUS
  Filled 2018-01-04: qty 5

## 2018-01-04 MED ORDER — CEPHALEXIN 500 MG PO CAPS: 500.0000 mg | ORAL_CAPSULE | Freq: Three times a day (TID) | ORAL | 0 refills | Status: DC

## 2018-01-04 MED ORDER — HEPARIN SOD (PORK) LOCK FLUSH 100 UNIT/ML IV SOLN
500.0000 [IU] | Freq: Once | INTRAVENOUS | Status: AC
  Administered 2018-01-04: 500 [IU] via INTRAVENOUS

## 2018-01-04 NOTE — Discharge Instructions (Signed)
As we discussed, your lab work was actually quite reassuring tonight.  Your white blood cell count was 11.1, and based on the differential, your ANC calculates at about 8000.  This is a dramatic improvement from your lab work before the injection you received 1 to 2 days ago.  It is still our recommendation that you be admitted for continued observation and antibiotics at least until your blood work comes out, but we understand that you need to go home to take care of your family even though you understand you are leaving against medical advice.  If you do have an infection in the setting of your chemotherapy, the infection could be severe and even life-threatening.  Please be sure to call Dr. Derrill Center first thing in the morning.  Let him know that you received his cefepime 2 g IV (a strong antibiotic) and that blood cultures and urine culture are pending.

## 2018-01-04 NOTE — ED Notes (Signed)
Patient discharged to home per MD order. Patient in stable condition, and deemed medically cleared by ED provider for discharge. Discharge instructions reviewed with patient/family using "Teach Back"; verbalized understanding of medication education and administration, and information about follow-up care. Denies further concerns. ° °

## 2018-01-05 LAB — URINE CULTURE

## 2018-01-08 LAB — CULTURE, BLOOD (ROUTINE X 2)
CULTURE: NO GROWTH
Culture: NO GROWTH
Special Requests: ADEQUATE

## 2018-04-09 ENCOUNTER — Encounter: Payer: Self-pay | Admitting: Emergency Medicine

## 2018-04-09 ENCOUNTER — Emergency Department
Admission: EM | Admit: 2018-04-09 | Discharge: 2018-04-09 | Disposition: A | Discharge status: Home / Self Care | Payer: PRIVATE HEALTH INSURANCE | Attending: Emergency Medicine | Admitting: Emergency Medicine

## 2018-04-09 ENCOUNTER — Other Ambulatory Visit: Payer: Self-pay

## 2018-04-09 DIAGNOSIS — N3001 Acute cystitis with hematuria: Secondary | ICD-10-CM | POA: Diagnosis not present

## 2018-04-09 DIAGNOSIS — Z79899 Other long term (current) drug therapy: Secondary | ICD-10-CM | POA: Insufficient documentation

## 2018-04-09 DIAGNOSIS — Z853 Personal history of malignant neoplasm of breast: Secondary | ICD-10-CM | POA: Insufficient documentation

## 2018-04-09 DIAGNOSIS — Z87891 Personal history of nicotine dependence: Secondary | ICD-10-CM | POA: Insufficient documentation

## 2018-04-09 DIAGNOSIS — R109 Unspecified abdominal pain: Secondary | ICD-10-CM

## 2018-04-09 DIAGNOSIS — R1032 Left lower quadrant pain: Secondary | ICD-10-CM | POA: Diagnosis present

## 2018-04-09 LAB — POCT PREGNANCY, URINE: PREG TEST UR: NEGATIVE

## 2018-04-09 LAB — CBC WITH DIFFERENTIAL/PLATELET
Abs Immature Granulocytes: 0.01 10*3/uL (ref 0.00–0.07)
BASOS ABS: 0.1 10*3/uL (ref 0.0–0.1)
BASOS PCT: 1 %
Eosinophils Absolute: 0.3 10*3/uL (ref 0.0–0.5)
Eosinophils Relative: 4 %
HCT: 42.4 % (ref 36.0–46.0)
HEMOGLOBIN: 14.4 g/dL (ref 12.0–15.0)
Immature Granulocytes: 0 %
LYMPHS PCT: 32 %
Lymphs Abs: 2 10*3/uL (ref 0.7–4.0)
MCH: 33.2 pg (ref 26.0–34.0)
MCHC: 34 g/dL (ref 30.0–36.0)
MCV: 97.7 fL (ref 80.0–100.0)
Monocytes Absolute: 0.7 10*3/uL (ref 0.1–1.0)
Monocytes Relative: 11 %
NEUTROS ABS: 3.2 10*3/uL (ref 1.7–7.7)
NRBC: 0 % (ref 0.0–0.2)
Neutrophils Relative %: 52 %
PLATELETS: 220 10*3/uL (ref 150–400)
RBC: 4.34 MIL/uL (ref 3.87–5.11)
RDW: 13.7 % (ref 11.5–15.5)
WBC: 6.1 10*3/uL (ref 4.0–10.5)

## 2018-04-09 LAB — COMPREHENSIVE METABOLIC PANEL
ALBUMIN: 4.2 g/dL (ref 3.5–5.0)
ALK PHOS: 77 U/L (ref 38–126)
ALT: 15 U/L (ref 0–44)
ANION GAP: 6 (ref 5–15)
AST: 21 U/L (ref 15–41)
BUN: 9 mg/dL (ref 6–20)
CALCIUM: 9.4 mg/dL (ref 8.9–10.3)
CHLORIDE: 106 mmol/L (ref 98–111)
CO2: 26 mmol/L (ref 22–32)
Creatinine, Ser: 0.67 mg/dL (ref 0.44–1.00)
GFR calc Af Amer: >60 mL/min (ref >60)
GFR calc non Af Amer: >60 mL/min (ref >60)
Glucose, Bld: 93 mg/dL (ref 70–99)
POTASSIUM: 3.6 mmol/L (ref 3.5–5.1)
SODIUM: 138 mmol/L (ref 135–145)
Total Bilirubin: 0.3 mg/dL (ref 0.3–1.2)
Total Protein: 7.3 g/dL (ref 6.5–8.1)

## 2018-04-09 LAB — URINALYSIS, COMPLETE (UACMP) WITH MICROSCOPIC
BILIRUBIN URINE: NEGATIVE
GLUCOSE, UA: NEGATIVE mg/dL
Ketones, ur: NEGATIVE mg/dL
LEUKOCYTE UA: NEGATIVE
NITRITE: NEGATIVE
Protein, ur: NEGATIVE mg/dL
SPECIFIC GRAVITY, URINE: 1.011 (ref 1.005–1.030)
pH: 5 (ref 5.0–8.0)

## 2018-04-09 MED ORDER — CEPHALEXIN 500 MG PO CAPS
500.0000 mg | ORAL_CAPSULE | Freq: Once | ORAL | Status: AC
  Administered 2018-04-09: 500 mg via ORAL
  Filled 2018-04-09: qty 1

## 2018-04-09 MED ORDER — CEPHALEXIN 500 MG PO CAPS: 500.0000 mg | ORAL_CAPSULE | Freq: Two times a day (BID) | ORAL | 0 refills | Status: AC

## 2018-04-09 NOTE — ED Triage Notes (Addendum)
Patient ambulatory to triage with steady gait, without difficulty or distress noted, mask in place; pt reports left sided flank/side pain today with no accomp symptoms, st hx of same with "kidney infection"; st sharp pain with deep inspiration; rt mastectomy 1/16, last chemo 12/3

## 2018-04-09 NOTE — ED Provider Notes (Signed)
[facility] Emergency Department Provider Note  ____________________________________________  Time seen: Approximately 11:06 PM  I have reviewed the triage vital signs and the nursing notes.   HISTORY  Chief Complaint Flank Pain    HPI [name] is a 43 y.o. female Libby Maw to the emergency department for treatment and evaluation of left-sided flank pain that started today.  She has a history of the same and reports that she had a kidney infection.  She has had some intermittent urinary frequency but denies dysuria.  No alleviating measures attempted prior to arrival.  She denies fever.   Past Medical History:  Diagnosis Date  . Cancer (HCC)    Breast  . DJD (degenerative joint disease)   . Thyroid disease     Patient Active Problem List   Diagnosis Date Noted  . TOBACCO ABUSE 05/21/2007  . GERD 05/21/2007  . 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 GOITER NOS 04/13/2006  . OBESITY, NOS 04/13/2006  . DEPRESSION, MAJOR, RECURRENT 04/13/2006    History reviewed. No pertinent surgical history.  Prior to Admission medications   Medication Sig Start Date End Date Taking? Authorizing Provider  azithromycin (ZITHROMAX Z-PAK) 250 MG tablet Take 2 tablets (500 mg) on  Day 1,  followed by 1 tablet (250 mg) once daily on Days 2 through 5. 04/24/15   Menshew, Dannielle Karvonen, PA-C  benzonatate (TESSALON PERLES) 100 MG capsule Take 1 capsule (100 mg total) by mouth 3 (three) times daily as needed for cough (Take 1-2 per dose). 04/24/15   Menshew, Dannielle Karvonen, PA-C  cephALEXin (KEFLEX) 500 MG capsule Take 1 capsule (500 mg total) by mouth 2 (two) times daily for 7 days. 04/09/18 04/16/18  Johnnell Liou, Dessa Phi, FNP  fluticasone (FLONASE) 50 MCG/ACT nasal spray Place 2 sprays into both nostrils daily. 04/24/15   Menshew, Dannielle Karvonen, PA-C  predniSONE (DELTASONE) 50 MG tablet Take one tablet by mouth daily for the next five days. 08/18/16   Lannie Fields, PA-C     Allergies Buspirone  No family history on file.  Social History Social History   Tobacco Use  . Smoking status: Former Research scientist (life sciences)  . Smokeless tobacco: Never Used  Substance Use Topics  . Alcohol use: No  . Drug use: No    Review of Systems Constitutional: Negative for fever. Respiratory: Negative for shortness of breath or cough. Gastrointestinal: Negative for abdominal pain; negative for nausea , negative for vomiting. Genitourinary: Negative for dysuria , negative for vaginal discharge.  Positive for urinary frequency Musculoskeletal: Positive for back pain. Skin: Negative for acute skin changes/rash/lesion. ____________________________________________   PHYSICAL EXAM:  VITAL SIGNS: ED Triage Vitals  Enc Vitals Group     BP 04/09/18 1920 120/78     Pulse Rate 04/09/18 1920 85     Resp 04/09/18 1920 18     Temp 04/09/18 1920 97.7 F (36.5 C)     Temp Source 04/09/18 1920 Oral     SpO2 04/09/18 1920 100 %     Weight 04/09/18 1921 235 lb (106.6 kg)     Height 04/09/18 1921 5\' 11"  (1.803 m)     Head Circumference --      Peak Flow --      Pain Score 04/09/18 1920 7     Pain Loc --      Pain Edu? --      Excl. in [place]? --     Constitutional: Alert and oriented. Well appearing and  in no acute distress. Eyes: Conjunctivae are normal. Head: Atraumatic. Nose: No congestion/rhinnorhea. Mouth/Throat: Mucous membranes are moist. Respiratory: Normal respiratory effort.  No retractions. Gastrointestinal: Bowel sounds active x 4; Abdomen is soft without rebound or guarding. Genitourinary: Pelvic exam: Not indicated.  Left flank pain without CVA tenderness. Musculoskeletal: No extremity tenderness nor edema.  Neurologic:  Normal speech and language. No gross focal neurologic deficits are appreciated. Speech is normal. No gait instability. Skin:  Skin is warm, dry and intact. No rash noted on exposed skin. Psychiatric: Mood and affect are normal. Speech and behavior are  normal.  ____________________________________________   LABS (all labs ordered are listed, but only abnormal results are displayed)  Labs Reviewed  URINALYSIS, COMPLETE (UACMP) WITH MICROSCOPIC - Abnormal; Notable for the following components:      Result Value   Color, Urine YELLOW (*)    APPearance CLEAR (*)    Hgb urine dipstick SMALL (*)    Bacteria, UA FEW (*)    All other components within normal limits  CBC WITH DIFFERENTIAL/PLATELET  COMPREHENSIVE METABOLIC PANEL  POCT PREGNANCY, URINE   ____________________________________________  RADIOLOGY  Not indicated ____________________________________________  Procedures  ____________________________________________  43 year old female presenting to the emergency department for treatment and evaluation of left flank pain that radiates a little bit into the left abdomen.  Differential diagnosis would also include kidney stone, however the patient does not have any history of kidney stone and states that her last kidney infection felt similar to symptoms she is experiencing now.  Her urinalysis does show that she has 6-10 white blood cells with a few bacteria and small amount of hemoglobin with some mucus.  There are no crystals noted in the results.  She will be treated with Keflex for presumed acute cystitis/early pyelonephritis.  She was encouraged to follow-up with primary care to have a repeat urinalysis in 10 to 12 days.  She was encouraged to return to the emergency department for symptoms of change or worsen if she is unable to schedule an appointment.  Patient states that she does have pain medication at home if she needs to take it.  INITIAL IMPRESSION / ASSESSMENT AND PLAN / ED COURSE  Pertinent labs & imaging results that were available during my care of the patient were reviewed by me and considered in my medical decision making (see chart for details).  ____________________________________________   FINAL CLINICAL  IMPRESSION(S) / ED DIAGNOSES  Final diagnoses:  Left flank pain  Acute cystitis with hematuria    Note:  This document was prepared using Dragon voice recognition software and may include unintentional dictation errors.    Victorino Dike, FNP 04/09/18 2310    Nance Pear, MD 04/09/18 2322

## 2018-04-09 NOTE — Discharge Instructions (Signed)
Follow-up with your primary care provider in about 10 to 14 days for repeat urinalysis.  Follow-up sooner for symptoms that are not improving with medication.  Return to the emergency department for symptoms that change or worsen or for new concerns if you are unable to schedule appointment.

## 2018-04-09 NOTE — ED Notes (Signed)
Pt reports that she has a portacath in place; instr on importance of performing labs to assist in triage and acuity process; pt voices understanding and gives permission to use periph access for blood draw

## 2018-05-19 ENCOUNTER — Emergency Department: Payer: PRIVATE HEALTH INSURANCE

## 2018-05-19 ENCOUNTER — Other Ambulatory Visit: Payer: Self-pay

## 2018-05-19 ENCOUNTER — Encounter: Payer: Self-pay | Admitting: Emergency Medicine

## 2018-05-19 ENCOUNTER — Emergency Department
Admission: EM | Admit: 2018-05-19 | Discharge: 2018-05-19 | Disposition: A | Discharge status: Home / Self Care | Payer: PRIVATE HEALTH INSURANCE | Attending: Emergency Medicine | Admitting: Emergency Medicine

## 2018-05-19 DIAGNOSIS — R1084 Generalized abdominal pain: Secondary | ICD-10-CM

## 2018-05-19 DIAGNOSIS — Z87891 Personal history of nicotine dependence: Secondary | ICD-10-CM | POA: Diagnosis not present

## 2018-05-19 DIAGNOSIS — R112 Nausea with vomiting, unspecified: Secondary | ICD-10-CM

## 2018-05-19 DIAGNOSIS — R102 Pelvic and perineal pain unspecified side: Secondary | ICD-10-CM

## 2018-05-19 LAB — COMPREHENSIVE METABOLIC PANEL
ALT: 18 U/L (ref 0–44)
AST: 22 U/L (ref 15–41)
Albumin: 4.9 g/dL (ref 3.5–5.0)
Alkaline Phosphatase: 73 U/L (ref 38–126)
Anion gap: 12 (ref 5–15)
BUN: 12 mg/dL (ref 6–20)
CO2: 19 mmol/L — ABNORMAL LOW (ref 22–32)
Calcium: 9.9 mg/dL (ref 8.9–10.3)
Chloride: 107 mmol/L (ref 98–111)
Creatinine, Ser: 0.59 mg/dL (ref 0.44–1.00)
GFR calc Af Amer: >60 mL/min (ref >60)
GFR calc non Af Amer: >60 mL/min (ref >60)
Glucose, Bld: 138 mg/dL — ABNORMAL HIGH (ref 70–99)
Potassium: 3.3 mmol/L — ABNORMAL LOW (ref 3.5–5.1)
Sodium: 138 mmol/L (ref 135–145)
Total Bilirubin: 0.7 mg/dL (ref 0.3–1.2)
Total Protein: 8 g/dL (ref 6.5–8.1)

## 2018-05-19 LAB — URINALYSIS, COMPLETE (UACMP) WITH MICROSCOPIC
Bacteria, UA: NONE SEEN
Bilirubin Urine: NEGATIVE
Glucose, UA: NEGATIVE mg/dL
Hgb urine dipstick: NEGATIVE
Ketones, ur: 20 mg/dL — AB
Leukocytes,Ua: NEGATIVE
Nitrite: NEGATIVE
Protein, ur: 30 mg/dL — AB
Specific Gravity, Urine: 1.025 (ref 1.005–1.030)
pH: 6 (ref 5.0–8.0)

## 2018-05-19 LAB — CBC
HCT: 41.6 % (ref 36.0–46.0)
Hemoglobin: 14.9 g/dL (ref 12.0–15.0)
MCH: 33 pg (ref 26.0–34.0)
MCHC: 35.8 g/dL (ref 30.0–36.0)
MCV: 92 fL (ref 80.0–100.0)
Platelets: 255 10*3/uL (ref 150–400)
RBC: 4.52 MIL/uL (ref 3.87–5.11)
RDW: 14.3 % (ref 11.5–15.5)
WBC: 10.3 10*3/uL (ref 4.0–10.5)
nRBC: 0 % (ref 0.0–0.2)

## 2018-05-19 LAB — POCT PREGNANCY, URINE: Preg Test, Ur: NEGATIVE

## 2018-05-19 LAB — LIPASE, BLOOD: Lipase: 28 U/L (ref 11–51)

## 2018-05-19 MED ORDER — ONDANSETRON HCL 4 MG/2ML IJ SOLN
4.0000 mg | Freq: Once | INTRAMUSCULAR | Status: AC
  Administered 2018-05-19: 4 mg via INTRAVENOUS
  Filled 2018-05-19: qty 2

## 2018-05-19 MED ORDER — DICYCLOMINE HCL 10 MG PO CAPS
10.0000 mg | ORAL_CAPSULE | Freq: Once | ORAL | Status: AC
  Administered 2018-05-19: 10 mg via ORAL
  Filled 2018-05-19: qty 1

## 2018-05-19 MED ORDER — IOHEXOL 300 MG/ML  SOLN
100.0000 mL | Freq: Once | INTRAMUSCULAR | Status: AC | PRN
  Administered 2018-05-19: 100 mL via INTRAVENOUS

## 2018-05-19 MED ORDER — SODIUM CHLORIDE 0.9% FLUSH: 3.0000 mL | Freq: Once | INTRAVENOUS | Status: DC

## 2018-05-19 MED ORDER — DICYCLOMINE HCL 10 MG PO CAPS: 10.0000 mg | ORAL_CAPSULE | Freq: Four times a day (QID) | ORAL | 1 refills | Status: DC

## 2018-05-19 MED ORDER — ONDANSETRON 4 MG PO TBDP: 4.0000 mg | ORAL_TABLET | Freq: Three times a day (TID) | ORAL | 0 refills | Status: DC | PRN

## 2018-05-19 MED ORDER — FENTANYL CITRATE (PF) 100 MCG/2ML IJ SOLN
100.0000 ug | Freq: Once | INTRAMUSCULAR | Status: AC
  Administered 2018-05-19: 100 ug via INTRAVENOUS
  Filled 2018-05-19: qty 2

## 2018-05-19 MED ORDER — PROMETHAZINE HCL 25 MG/ML IJ SOLN: 25.0000 mg | Freq: Once | INTRAMUSCULAR | Status: DC

## 2018-05-19 MED ORDER — KETOROLAC TROMETHAMINE 30 MG/ML IJ SOLN
30.0000 mg | Freq: Once | INTRAMUSCULAR | Status: AC
  Administered 2018-05-19: 22:00:00 30 mg via INTRAVENOUS
  Filled 2018-05-19: qty 1

## 2018-05-19 MED ORDER — PROMETHAZINE HCL 25 MG/ML IJ SOLN
25.0000 mg | Freq: Once | INTRAMUSCULAR | Status: AC
  Administered 2018-05-19: 18:00:00 25 mg via INTRAVENOUS

## 2018-05-19 MED ORDER — IOHEXOL 240 MG/ML SOLN
50.0000 mL | Freq: Once | INTRAMUSCULAR | Status: AC | PRN
  Administered 2018-05-19: 19:00:00 50 mL via ORAL

## 2018-05-19 NOTE — ED Notes (Signed)
Patient transported to CT 

## 2018-05-19 NOTE — ED Notes (Signed)
Report given to April RN

## 2018-05-19 NOTE — ED Notes (Signed)
Pt states "I think this is crazy, i'm not feeling better and you're sending me home". Pt states pain is worse after toradol and zofran. Pt rated pain previously 10/10 and now states pain is worse. Roderic Palau, pa in to speak with pt.

## 2018-05-19 NOTE — ED Notes (Signed)
Per John CT tech, patient requests pain meds.

## 2018-05-19 NOTE — ED Notes (Signed)
Patient went to bathroom independently.

## 2018-05-19 NOTE — ED Notes (Signed)
Patient appears more comfortable at this time. Patient is no longer crying out or moaning.

## 2018-05-19 NOTE — ED Provider Notes (Signed)
[facility] Emergency Department Provider Note  ____________________________________________  Time seen: Approximately 6:00 PM  I have reviewed the triage vital signs and the nursing notes.   HISTORY  Chief Complaint Abdominal Pain    HPI [name] is a 43 y.o. female who presents the emergency department complaining of severe diffuse abdominal pain.  Patient reports that her pain began this morning, first it was an ache that has intensified throughout the day.  Patient is curled in a fetal position, holding her abdomen during interview.  She denies any fevers or chills but does endorse nausea and vomiting.  No diarrhea or constipation.  She denies any urinary complaints of dysuria, polyuria, hematuria.  No flank pain.  Patient states that pain is diffuse throughout the abdomen, possibly slightly worse in bilateral lower quadrants.  No vaginal bleeding or discharge.  She states that she has had a hysterectomy and cholecystectomy.  She just underwent treatment for breast cancer but has finished all treatments at this time.  Patient does take chronic pain medications but states that pain medications are not helping this pain whatsoever.  She denies any fevers or chills, chest pain, shortness of breath, musculoskeletal pain.         Past Medical History:  Diagnosis Date  . Cancer (HCC)    Breast  . DJD (degenerative joint disease)   . Thyroid disease     Patient Active Problem List   Diagnosis Date Noted  . TOBACCO ABUSE 05/21/2007  . GERD 05/21/2007  . 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 GOITER NOS 04/13/2006  . OBESITY, NOS 04/13/2006  . DEPRESSION, MAJOR, RECURRENT 04/13/2006    History reviewed. No pertinent surgical history.  Prior to Admission medications   Medication Sig Start Date End Date Taking? Authorizing Provider  azithromycin (ZITHROMAX Z-PAK) 250 MG tablet Take 2 tablets (500 mg) on  Day 1,  followed by 1 tablet (250 mg) once  daily on Days 2 through 5. 04/24/15   Menshew, Dannielle Karvonen, PA-C  benzonatate (TESSALON PERLES) 100 MG capsule Take 1 capsule (100 mg total) by mouth 3 (three) times daily as needed for cough (Take 1-2 per dose). 04/24/15   Menshew, Dannielle Karvonen, PA-C  dicyclomine (BENTYL) 10 MG capsule Take 1 capsule (10 mg total) by mouth 4 (four) times daily for 14 days. 05/19/18 06/02/18  , Charline Bills, PA-C  fluticasone (FLONASE) 50 MCG/ACT nasal spray Place 2 sprays into both nostrils daily. 04/24/15   Menshew, Dannielle Karvonen, PA-C  ondansetron (ZOFRAN-ODT) 4 MG disintegrating tablet Take 1 tablet (4 mg total) by mouth every 8 (eight) hours as needed for nausea or vomiting. 05/19/18   , Charline Bills, PA-C  predniSONE (DELTASONE) 50 MG tablet Take one tablet by mouth daily for the next five days. 08/18/16   Lannie Fields, PA-C    Allergies Buspirone  No family history on file.  Social History Social History   Tobacco Use  . Smoking status: Former Research scientist (life sciences)  . Smokeless tobacco: Never Used  Substance Use Topics  . Alcohol use: No  . Drug use: No     Review of Systems  Constitutional: No fever/chills Eyes: No visual changes. No discharge ENT: No upper respiratory complaints. Cardiovascular: no chest pain. Respiratory: no cough. No SOB. Gastrointestinal: Positive for severe, diffuse abdominal pain.  Positive for nausea and vomiting.  No diarrhea.  No constipation. Genitourinary: Negative for dysuria. No hematuria Musculoskeletal: Negative for musculoskeletal pain. Skin: Negative for rash,  abrasions, lacerations, ecchymosis. Neurological: Negative for headaches, focal weakness or numbness. 10-point ROS otherwise negative.  ____________________________________________   PHYSICAL EXAM:  VITAL SIGNS: ED Triage Vitals [05/19/18 1757]  Enc Vitals Group     BP 120/86     Pulse Rate 73     Resp      Temp 98 F (36.7 C)     Temp Source Oral     SpO2 99 %     Weight      Height       Head Circumference      Peak Flow      Pain Score 10     Pain Loc      Pain Edu?      Excl. in [place]?      Constitutional: Alert and oriented. Well appearing and in no acute distress. Eyes: Conjunctivae are normal. PERRL. EOMI. Head: Atraumatic. ENT:      Ears:       Nose: No congestion/rhinnorhea.      Mouth/Throat: Mucous membranes are moist.  Oropharynx is nonerythematous and nonedematous.  Uvula is midline. Neck: No stridor.   Hematological/Lymphatic/Immunilogical: No cervical lymphadenopathy. Cardiovascular: Normal rate, regular rhythm. Normal S1 and S2.  Good peripheral circulation. Respiratory: Normal respiratory effort without tachypnea or retractions. Lungs CTAB. Good air entry to the bases with no decreased or absent breath sounds. Gastrointestinal: No visible external abdominal wall findings.  Bowel sounds 4 quadrants.  Patient is diffusely tender to palpation, reportedly more tender to palpation bilateral lower quadrants..  Positive for guarding but no appreciable rigidity. No palpable masses. No distention.  Positive for right-sided CVA tenderness, none on the left. Musculoskeletal: Full range of motion to all extremities. No gross deformities appreciated. Neurologic:  Normal speech and language. No gross focal neurologic deficits are appreciated.  Skin:  Skin is warm, dry and intact. No rash noted. Psychiatric: Mood and affect are normal. Speech and behavior are normal. Patient exhibits appropriate insight and judgement.   ____________________________________________   LABS (all labs ordered are listed, but only abnormal results are displayed)  Labs Reviewed  COMPREHENSIVE METABOLIC PANEL - Abnormal; Notable for the following components:      Result Value   Potassium 3.3 (*)    CO2 19 (*)    Glucose, Bld 138 (*)    All other components within normal limits  URINALYSIS, COMPLETE (UACMP) WITH MICROSCOPIC - Abnormal; Notable for the following components:   Color,  Urine YELLOW (*)    APPearance HAZY (*)    Ketones, ur 20 (*)    Protein, ur 30 (*)    All other components within normal limits  LIPASE, BLOOD  CBC  POCT PREGNANCY, URINE  POC URINE PREG, ED   ____________________________________________  EKG   ____________________________________________  RADIOLOGY I personally viewed and evaluated these images as part of my medical decision making, as well as reviewing the written report by the radiologist.  Ct Abdomen Pelvis W Contrast  Result Date: 05/19/2018 CLINICAL DATA:  Generalized abdominal pain beginning this morning with nausea and vomiting. EXAM: CT ABDOMEN AND PELVIS WITH CONTRAST TECHNIQUE: Multidetector CT imaging of the abdomen and pelvis was performed using the standard protocol following bolus administration of intravenous contrast. CONTRAST:  155mL OMNIPAQUE IOHEXOL 300 MG/ML  SOLN COMPARISON:  None. FINDINGS: Lower chest: 4 mm sub solid peripheral nodule over the right lower lobe. Hepatobiliary: Previous cholecystectomy. Liver and biliary tree are normal. Pancreas: Normal. Spleen: Normal. Adrenals/Urinary Tract: Adrenal glands are normal. Kidneys normal in  size without hydronephrosis or nephrolithiasis. Subcentimeter cortical hypodensity over the upper pole right kidney too small to characterize but likely a cyst. Ureters and bladder are normal. Stomach/Bowel: Stomach and small bowel are normal. Appendix not visualized. Colon is normal. Vascular/Lymphatic: Minimal calcified plaque over the abdominal aorta. No adenopathy. Reproductive: Normal. Other: No free fluid or focal inflammatory change. Musculoskeletal: No acute findings. IMPRESSION: No acute findings in the abdomen/pelvis. Subcentimeter right renal cortical hypodensity too small to characterize but likely a cyst. Electronically Signed   By: Marin Olp M.D.   On: 05/19/2018 19:48   US Pelvic Doppler (torsion R/o Or Mass Arterial Flow)  Result Date: 05/19/2018 CLINICAL DATA:   Severe abdominal pain with nausea and vomiting. EXAM: TRANSABDOMINAL AND TRANSVAGINAL ULTRASOUND OF PELVIS DOPPLER ULTRASOUND OF OVARIES TECHNIQUE: Both transabdominal and transvaginal ultrasound examinations of the pelvis were performed. Transabdominal technique was performed for global imaging of the pelvis including uterus, ovaries, adnexal regions, and pelvic cul-de-sac. It was necessary to proceed with endovaginal exam following the transabdominal exam to visualize the endometrium and ovaries. Color and duplex Doppler ultrasound was utilized to evaluate blood flow to the ovaries. COMPARISON:  None. FINDINGS: Uterus Measurements: 6.5 x 3.2 x 4.5 cm = volume: 49.8 mL. No fibroids or other mass visualized. Endometrium Thickness: 1 mm.  No focal abnormality visualized. Right ovary Measurements: 2.5 x 1.8 x 1.7 cm = volume: 4.1 mL. Normal appearance/no adnexal mass. Left ovary Measurements: 2.6 x 1.2 x 1.6 cm = volume: 2.8 mL. Normal appearance/no adnexal mass. Pulsed Doppler evaluation of both ovaries demonstrates normal low-resistance arterial and venous waveforms. Other findings No abnormal free fluid. IMPRESSION: No cause for the patient's pain identified. Electronically Signed   By: Dorise Bullion III M.D   On: 05/19/2018 19:46   US Pelvic Complete With Transvaginal  Result Date: 05/19/2018 CLINICAL DATA:  Severe abdominal pain with nausea and vomiting. EXAM: TRANSABDOMINAL AND TRANSVAGINAL ULTRASOUND OF PELVIS DOPPLER ULTRASOUND OF OVARIES TECHNIQUE: Both transabdominal and transvaginal ultrasound examinations of the pelvis were performed. Transabdominal technique was performed for global imaging of the pelvis including uterus, ovaries, adnexal regions, and pelvic cul-de-sac. It was necessary to proceed with endovaginal exam following the transabdominal exam to visualize the endometrium and ovaries. Color and duplex Doppler ultrasound was utilized to evaluate blood flow to the ovaries. COMPARISON:  None.  FINDINGS: Uterus Measurements: 6.5 x 3.2 x 4.5 cm = volume: 49.8 mL. No fibroids or other mass visualized. Endometrium Thickness: 1 mm.  No focal abnormality visualized. Right ovary Measurements: 2.5 x 1.8 x 1.7 cm = volume: 4.1 mL. Normal appearance/no adnexal mass. Left ovary Measurements: 2.6 x 1.2 x 1.6 cm = volume: 2.8 mL. Normal appearance/no adnexal mass. Pulsed Doppler evaluation of both ovaries demonstrates normal low-resistance arterial and venous waveforms. Other findings No abnormal free fluid. IMPRESSION: No cause for the patient's pain identified. Electronically Signed   By: Dorise Bullion III M.D   On: 05/19/2018 19:46    ____________________________________________    PROCEDURES  Procedure(s) performed:    Procedures    Medications  sodium chloride flush (NS) 0.9 % injection 3 mL (3 mLs Intravenous Not Given 05/19/18 1759)  ondansetron (ZOFRAN) injection 4 mg (has no administration in time range)  ketorolac (TORADOL) 30 MG/ML injection 30 mg (has no administration in time range)  dicyclomine (BENTYL) capsule 10 mg (has no administration in time range)  fentaNYL (SUBLIMAZE) injection 100 mcg (100 mcg Intravenous Given 05/19/18 1828)  promethazine (PHENERGAN) injection 25 mg (25 mg  Intravenous Given 05/19/18 1820)  iohexol (OMNIPAQUE) 240 MG/ML injection 50 mL (50 mLs Oral Contrast Given 05/19/18 1832)  iohexol (OMNIPAQUE) 300 MG/ML solution 100 mL (100 mLs Intravenous Contrast Given 05/19/18 1932)  fentaNYL (SUBLIMAZE) injection 100 mcg (100 mcg Intravenous Given 05/19/18 1947)     ____________________________________________   INITIAL IMPRESSION / ASSESSMENT AND PLAN / ED COURSE  Pertinent labs & imaging results that were available during my care of the patient were reviewed by me and considered in my medical decision making (see chart for details).  Review of the  [place]RS was performed in accordance of the [place] prior to dispensing any controlled drugs.  Clinical Course as  of May 19 2119  Sat May 19, 2018  1818 Patient presented to the emergency department with severe diffuse abdominal pain.  Patient is curled in a fetal position, crying and yelling due to pain.  On exam, patient is diffusely tender to palpation with apparent worsening tenderness to palpation of the bilateral lower quadrants.  Patient has had a appendectomy as well as cholecystectomy.  Patient has just undergone treatment for breast cancer.  Differential includes metastatic disease, bowel obstruction, mesenteric ischemia, ovarian torsion.  Less likely includes colitis, diverticulitis, bowel perforation, gastric ulcer, UTI.  Patient will be evaluated with labs, CT scan of the abdomen pelvis as well as ultrasound of the pelvis.   [JC]    Clinical Course User Index [JC] , Charline Bills, PA-C          Patient's diagnosis is consistent with nonspecific generalized abdominal pain.  Patient presented to the emergency department with complaint of diffuse abdominal pain.  Patient was tender to palpation globally over the abdomen.  No palpable abnormality.  Some guarding but no rigidity.  Patient was evaluated with labs, ultrasound and CT.  These returned with reassuring results.  At this time, no specific indication for patient's severe abdominal pain.  On reassessment, patient reports great symptomatic improvement after 2 doses of pain medication.  She did endorse a mild headache at the time for which she is treated with Toradol for both the abdomen and her headache.  Patient is given a prescription for Bentyl and Zofran at home.  Follow-up with gastroenterology if necessary.  Otherwise follow-up with primary care.  No further work-up at this time.  Patient is stable for discharge..  Patient is given ED precautions to return to the ED for any worsening or new symptoms.     ____________________________________________  FINAL CLINICAL IMPRESSION(S) / ED DIAGNOSES  Final diagnoses:  Generalized  abdominal pain  Non-intractable vomiting with nausea, unspecified vomiting type      NEW MEDICATIONS STARTED DURING THIS VISIT:  ED Discharge Orders         Ordered    ondansetron (ZOFRAN-ODT) 4 MG disintegrating tablet  Every 8 hours PRN     05/19/18 2118    dicyclomine (BENTYL) 10 MG capsule  4 times daily     05/19/18 2118              This chart was dictated using voice recognition software/Dragon. Despite best efforts to proofread, errors can occur which can change the meaning. Any change was purely unintentional.    Darletta Moll, PA-C 05/19/18 2121    Carrie Mew, MD 05/19/18 2140

## 2018-05-19 NOTE — ED Notes (Signed)
Pt yelling at staff regarding iv removal. Alcohol used by daniel, rn to remove tape securing iv. Pt angry, states "i'm just leaving since they can't fucking find anything wrong." attempted to review discharge instructions with pt who does not acknowledge review. Wheelchair offered for discharge to vehicle.

## 2018-05-19 NOTE — ED Triage Notes (Signed)
Pt to ED via POV c/o generalized abdominal pain that started this morning, pt has gotten worse as the day has progressed. N/V x 6, pt denies diarrhea or fever. Pt is yelling in pain.

## 2018-08-21 ENCOUNTER — Emergency Department: Payer: PRIVATE HEALTH INSURANCE

## 2018-08-21 ENCOUNTER — Encounter: Payer: Self-pay | Admitting: Emergency Medicine

## 2018-08-21 ENCOUNTER — Emergency Department
Admission: EM | Admit: 2018-08-21 | Discharge: 2018-08-21 | Disposition: A | Discharge status: Home / Self Care | Payer: PRIVATE HEALTH INSURANCE | Attending: Emergency Medicine | Admitting: Emergency Medicine

## 2018-08-21 ENCOUNTER — Other Ambulatory Visit: Payer: Self-pay

## 2018-08-21 DIAGNOSIS — Z853 Personal history of malignant neoplasm of breast: Secondary | ICD-10-CM | POA: Diagnosis not present

## 2018-08-21 DIAGNOSIS — Y69 Unspecified misadventure during surgical and medical care: Secondary | ICD-10-CM | POA: Insufficient documentation

## 2018-08-21 DIAGNOSIS — R109 Unspecified abdominal pain: Secondary | ICD-10-CM | POA: Diagnosis present

## 2018-08-21 DIAGNOSIS — R110 Nausea: Secondary | ICD-10-CM | POA: Diagnosis not present

## 2018-08-21 DIAGNOSIS — Z20828 Contact with and (suspected) exposure to other viral communicable diseases: Secondary | ICD-10-CM | POA: Insufficient documentation

## 2018-08-21 DIAGNOSIS — T8149XA Infection following a procedure, other surgical site, initial encounter: Secondary | ICD-10-CM | POA: Diagnosis not present

## 2018-08-21 DIAGNOSIS — Z87891 Personal history of nicotine dependence: Secondary | ICD-10-CM | POA: Insufficient documentation

## 2018-08-21 LAB — CBC WITH DIFFERENTIAL/PLATELET
Abs Immature Granulocytes: 0.07 10*3/uL (ref 0.00–0.07)
Basophils Absolute: 0.1 10*3/uL (ref 0.0–0.1)
Basophils Relative: 0 %
Eosinophils Absolute: 0.2 10*3/uL (ref 0.0–0.5)
Eosinophils Relative: 1 %
HCT: 46.6 % — ABNORMAL HIGH (ref 36.0–46.0)
Hemoglobin: 15.8 g/dL — ABNORMAL HIGH (ref 12.0–15.0)
Immature Granulocytes: 1 %
Lymphocytes Relative: 18 %
Lymphs Abs: 2.5 10*3/uL (ref 0.7–4.0)
MCH: 32.4 pg (ref 26.0–34.0)
MCHC: 33.9 g/dL (ref 30.0–36.0)
MCV: 95.7 fL (ref 80.0–100.0)
Monocytes Absolute: 0.9 10*3/uL (ref 0.1–1.0)
Monocytes Relative: 6 %
Neutro Abs: 10.1 10*3/uL — ABNORMAL HIGH (ref 1.7–7.7)
Neutrophils Relative %: 74 %
Platelets: 343 10*3/uL (ref 150–400)
RBC: 4.87 MIL/uL (ref 3.87–5.11)
RDW: 15.1 % (ref 11.5–15.5)
WBC: 13.8 10*3/uL — ABNORMAL HIGH (ref 4.0–10.5)
nRBC: 0 % (ref 0.0–0.2)

## 2018-08-21 LAB — URINALYSIS, COMPLETE (UACMP) WITH MICROSCOPIC
Bacteria, UA: NONE SEEN
Bilirubin Urine: NEGATIVE
Glucose, UA: NEGATIVE mg/dL
Hgb urine dipstick: NEGATIVE
Ketones, ur: 20 mg/dL — AB
Leukocytes,Ua: NEGATIVE
Nitrite: NEGATIVE
Protein, ur: 100 mg/dL — AB
Specific Gravity, Urine: >1.046 — ABNORMAL HIGH (ref 1.005–1.030)
pH: 7 (ref 5.0–8.0)

## 2018-08-21 LAB — COMPREHENSIVE METABOLIC PANEL
ALT: 12 U/L (ref 0–44)
AST: 22 U/L (ref 15–41)
Albumin: 5.1 g/dL — ABNORMAL HIGH (ref 3.5–5.0)
Alkaline Phosphatase: 132 U/L — ABNORMAL HIGH (ref 38–126)
Anion gap: 12 (ref 5–15)
BUN: 8 mg/dL (ref 6–20)
CO2: 25 mmol/L (ref 22–32)
Calcium: 10.8 mg/dL — ABNORMAL HIGH (ref 8.9–10.3)
Chloride: 103 mmol/L (ref 98–111)
Creatinine, Ser: 0.73 mg/dL (ref 0.44–1.00)
GFR calc Af Amer: >60 mL/min (ref >60)
GFR calc non Af Amer: >60 mL/min (ref >60)
Glucose, Bld: 144 mg/dL — ABNORMAL HIGH (ref 70–99)
Potassium: 3.8 mmol/L (ref 3.5–5.1)
Sodium: 140 mmol/L (ref 135–145)
Total Bilirubin: 0.6 mg/dL (ref 0.3–1.2)
Total Protein: 9.2 g/dL — ABNORMAL HIGH (ref 6.5–8.1)

## 2018-08-21 LAB — HCG, QUANTITATIVE, PREGNANCY: hCG, Beta Chain, Quant, S: 3 m[IU]/mL (ref <5)

## 2018-08-21 LAB — LIPASE, BLOOD: Lipase: 99 U/L — ABNORMAL HIGH (ref 11–51)

## 2018-08-21 LAB — SARS CORONAVIRUS 2 BY RT PCR (HOSPITAL ORDER, PERFORMED IN ~~LOC~~ HOSPITAL LAB): SARS Coronavirus 2: NEGATIVE

## 2018-08-21 MED ORDER — HYDROMORPHONE HCL 1 MG/ML IJ SOLN
1.0000 mg | Freq: Once | INTRAMUSCULAR | Status: AC
  Administered 2018-08-21: 1 mg via INTRAVENOUS
  Filled 2018-08-21: qty 1

## 2018-08-21 MED ORDER — ONDANSETRON HCL 4 MG/2ML IJ SOLN
4.0000 mg | Freq: Once | INTRAMUSCULAR | Status: AC
  Administered 2018-08-21: 4 mg via INTRAVENOUS
  Filled 2018-08-21: qty 2

## 2018-08-21 MED ORDER — ONDANSETRON HCL 4 MG/2ML IJ SOLN
4.0000 mg | Freq: Once | INTRAMUSCULAR | Status: AC
  Administered 2018-08-21: 18:00:00 4 mg via INTRAVENOUS
  Filled 2018-08-21: qty 2

## 2018-08-21 MED ORDER — VANCOMYCIN HCL IN DEXTROSE 1-5 GM/200ML-% IV SOLN
1000.0000 mg | Freq: Once | INTRAVENOUS | Status: AC
  Administered 2018-08-21: 1000 mg via INTRAVENOUS
  Filled 2018-08-21: qty 200

## 2018-08-21 MED ORDER — HYDROMORPHONE HCL 1 MG/ML IJ SOLN: 0.5000 mg | Freq: Once | INTRAMUSCULAR | Status: DC

## 2018-08-21 MED ORDER — HYDROMORPHONE HCL 1 MG/ML IJ SOLN
0.5000 mg | Freq: Once | INTRAMUSCULAR | Status: AC
  Administered 2018-08-21: 0.5 mg via INTRAVENOUS
  Filled 2018-08-21: qty 1

## 2018-08-21 MED ORDER — IOHEXOL 300 MG/ML  SOLN
100.0000 mL | Freq: Once | INTRAMUSCULAR | Status: AC | PRN
  Administered 2018-08-21: 100 mL via INTRAVENOUS

## 2018-08-21 MED ORDER — SODIUM CHLORIDE 0.9 % IV SOLN
1.0000 g | Freq: Once | INTRAVENOUS | Status: AC
  Administered 2018-08-21: 1 g via INTRAVENOUS
  Filled 2018-08-21: qty 1

## 2018-08-21 MED ORDER — HALOPERIDOL LACTATE 5 MG/ML IJ SOLN
5.0000 mg | Freq: Once | INTRAMUSCULAR | Status: AC
  Administered 2018-08-21: 5 mg via INTRAVENOUS
  Filled 2018-08-21: qty 1

## 2018-08-21 MED ORDER — IOHEXOL 240 MG/ML SOLN: 50.0000 mL | Freq: Once | INTRAMUSCULAR | Status: DC | PRN

## 2018-08-21 NOTE — ED Notes (Signed)
Pt tearful and wailing at top of her lungs that her belly hurts. Dr Cinda Quest notified. Received verbal orders for 1 mg dilaudid and 4 mg zofran.

## 2018-08-21 NOTE — Discharge Instructions (Addendum)
Dr. Abelino Derrick wanted to see you in the office at 9 AM.  He can drain the abscess there.  Please use your pain medicine as instructed tonight.  If the pain becomes intolerable or if you develop a high fever, vomiting or get sicker at all please return here.  We can always arrange treatment or transfer to [place] much more easily than trying to drive down there if you are very sick at night.  Please be aware that your lipase was slightly elevated.  This sometimes goes along with pancreatic inflammation but your pancreas looked completely normal on the CT so I doubt that.  Additionally on the CT it looks like you are going to develop diarrhea shortly.  Please also return for high fever vomiting frequent episodes of diarrhea or any other problems especially if the pain gets worse again after you see Dr. Abelino Derrick.

## 2018-08-21 NOTE — ED Notes (Signed)
Received verbal orders from Dr Cinda Quest for another 0.5 of dilaudid 15 minutes post initial dose.

## 2018-08-21 NOTE — ED Notes (Signed)
Patient transported to CT 

## 2018-08-21 NOTE — ED Provider Notes (Signed)
[facility] Emergency Department Provider Note   ____________________________________________   First MD Initiated Contact with Patient 08/21/18 1741     (approximate)  I have reviewed the triage vital signs and the nursing notes.   HISTORY  Chief Complaint Abdominal Pain and Emesis    HPI [name] is a 43 y.o. female who reports she had a flap procedure done at [facility] the end of May.  She developed severe mid abdominal pain about 2 hours previously.  On pain is severe worse with palpation worse with movement.  Surgical wound looks like it is healing well.  Patient says she is nauseous but is not vomiting and has not having diarrhea.  She had something like this a few months ago and CT was negative at that time.  We will re-CT here anyway because of the recent surgery.         Past Medical History:  Diagnosis Date  . Cancer (HCC)    Breast  . DJD (degenerative joint disease)   . Thyroid disease     Patient Active Problem List   Diagnosis Date Noted  . TOBACCO ABUSE 05/21/2007  . GERD 05/21/2007  . 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 GOITER NOS 04/13/2006  . OBESITY, NOS 04/13/2006  . DEPRESSION, MAJOR, RECURRENT 04/13/2006    History reviewed. No pertinent surgical history.  Prior to Admission medications   Medication Sig Start Date End Date Taking? Authorizing Provider  azithromycin (ZITHROMAX Z-PAK) 250 MG tablet Take 2 tablets (500 mg) on  Day 1,  followed by 1 tablet (250 mg) once daily on Days 2 through 5. 04/24/15   Menshew, Dannielle Karvonen, PA-C  benzonatate (TESSALON PERLES) 100 MG capsule Take 1 capsule (100 mg total) by mouth 3 (three) times daily as needed for cough (Take 1-2 per dose). 04/24/15   Menshew, Dannielle Karvonen, PA-C  dicyclomine (BENTYL) 10 MG capsule Take 1 capsule (10 mg total) by mouth 4 (four) times daily for 14 days. 05/19/18 06/02/18  Cuthriell, Charline Bills, PA-C  fluticasone (FLONASE) 50 MCG/ACT nasal spray  Place 2 sprays into both nostrils daily. 04/24/15   Menshew, Dannielle Karvonen, PA-C  ondansetron (ZOFRAN-ODT) 4 MG disintegrating tablet Take 1 tablet (4 mg total) by mouth every 8 (eight) hours as needed for nausea or vomiting. 05/19/18   Cuthriell, Charline Bills, PA-C  predniSONE (DELTASONE) 50 MG tablet Take one tablet by mouth daily for the next five days. 08/18/16   Lannie Fields, PA-C    Allergies Buspirone  No family history on file.  Social History Social History   Tobacco Use  . Smoking status: Former Research scientist (life sciences)  . Smokeless tobacco: Never Used  Substance Use Topics  . Alcohol use: No  . Drug use: No    Review of Systems  Constitutional: No fever/chills Eyes: No visual changes. ENT: No sore throat. Cardiovascular: Denies chest pain. Respiratory: Denies shortness of breath. Gastrointestinal:  abdominal pain. nausea, no vomiting.  No diarrhea.  No constipation. Genitourinary: Negative for dysuria. Musculoskeletal: Negative for back pain. Skin: Negative for rash. Neurological: Negative for headaches, focal weakness    ____________________________________________   PHYSICAL EXAM:  VITAL SIGNS: ED Triage Vitals  Enc Vitals Group     BP 08/21/18 1723 (!) 137/96     Pulse Rate 08/21/18 1723 76     Resp 08/21/18 1723 16     Temp 08/21/18 1723 97.9 F (36.6 C)     Temp Source 08/21/18 1723  Oral     SpO2 08/21/18 1723 99 %     Weight 08/21/18 1722 235 lb 0.2 oz (106.6 kg)     Height 08/21/18 1722 6' (1.829 m)     Head Circumference --      Peak Flow --      Pain Score 08/21/18 1722 10     Pain Loc --      Pain Edu? --      Excl. in [place]? --    Constitutional: Alert and oriented.  Patient yelling and screaming and moaning in pain constantly.  Eyes: Conjunctivae are normal.  Head: Atraumatic. Nose: No congestion/rhinnorhea. Mouth/Throat: Mucous membranes are moist.  Oropharynx non-erythematous. Neck: No stridor.   Cardiovascular: Normal rate, regular rhythm. Grossly  normal heart sounds.  Good peripheral circulation. Respiratory: Normal respiratory effort.  No retractions. Lungs CTAB. Gastrointestinal: Soft . No distention. No abdominal bruits. No CVA tenderness. Musculoskeletal: No lower extremity tenderness nor edema.   Neurologic:  Normal speech and language. No gross focal neurologic deficits are appreciated. No gait instability. Skin:  Skin is warm, dry and intact. No rash noted.   ____________________________________________   LABS (all labs ordered are listed, but only abnormal results are displayed)  Labs Reviewed  COMPREHENSIVE METABOLIC PANEL - Abnormal; Notable for the following components:      Result Value   Glucose, Bld 144 (*)    Calcium 10.8 (*)    Total Protein 9.2 (*)    Albumin 5.1 (*)    Alkaline Phosphatase 132 (*)    All other components within normal limits  LIPASE, BLOOD - Abnormal; Notable for the following components:   Lipase 99 (*)    All other components within normal limits  CBC WITH DIFFERENTIAL/PLATELET - Abnormal; Notable for the following components:   WBC 13.8 (*)    Hemoglobin 15.8 (*)    HCT 46.6 (*)    Neutro Abs 10.1 (*)    All other components within normal limits  SARS CORONAVIRUS 2 (HOSPITAL ORDER, [place] [facility])  HCG, QUANTITATIVE, PREGNANCY  URINALYSIS, COMPLETE (UACMP) WITH MICROSCOPIC   ____________________________________________  EKG   ____________________________________________  RADIOLOGY  ED MD interpretation:    Official radiology report(s): Ct Abdomen Pelvis W Contrast  Result Date: 08/21/2018 CLINICAL DATA:  Abdominal pain. The patient had a recent double mastectomy and flap surgery in May. EXAM: CT ABDOMEN AND PELVIS WITH CONTRAST TECHNIQUE: Multidetector CT imaging of the abdomen and pelvis was performed using the standard protocol following bolus administration of intravenous contrast. CONTRAST:  143mL OMNIPAQUE IOHEXOL 300 MG/ML  SOLN COMPARISON:  CT  dated 05/19/2018. FINDINGS: Lower chest: There is a 6 mm pulmonary nodule in the peripheral right lower lobe (axial series 5, image 1). There is atelectasis at the lung bases.The heart size is normal. Hepatobiliary: The liver is normal. Status post cholecystectomy.There is no biliary ductal dilation. Pancreas: Normal contours without ductal dilatation. No peripancreatic fluid collection. Spleen: No splenic laceration or hematoma. Adrenals/Urinary Tract: --Adrenal glands: No adrenal hemorrhage. --Right kidney/ureter: No hydronephrosis or perinephric hematoma. --Left kidney/ureter: No hydronephrosis or perinephric hematoma. --Urinary bladder: Unremarkable. Stomach/Bowel: --Stomach/Duodenum: No hiatal hernia or other gastric abnormality. Normal duodenal course and caliber. --Small bowel: There is mild dilatation of multiple fluid-filled loops of bowel in the pelvis without evidence of a frank bowel obstruction. --Colon: No focal abnormality. --Appendix: Not visualized. No right lower quadrant inflammation or free fluid. Vascular/Lymphatic: Atherosclerotic calcification is present within the non-aneurysmal abdominal aorta, without hemodynamically significant  stenosis. There is a circumaortic left renal vein, a normal variant. --No retroperitoneal lymphadenopathy. --No mesenteric lymphadenopathy. --No pelvic or inguinal lymphadenopathy. Reproductive: Unremarkable Other: No ascites or free air. There are extensive inflammatory changes in the low anterior abdominal wall. There is a 6.3 x 2.5 cm air and fluid collection with in the anterior abdominal wall. There are adjacent inflammatory changes. Multiple surgical clips are noted in this region. Musculoskeletal. No acute displaced fractures. IMPRESSION: 1. There is a 6.3 x 2.5 cm air and fluid collection in the low anterior abdominal wall concerning for an abscess. 2. There is a 6 mm pulmonary nodule in the right lower lobe as detailed above. Non-contrast chest CT at 6-12  months is recommended. If the nodule is stable at time of repeat CT, then future CT at 18-24 months (from today's scan) is considered optional for low-risk patients, but is recommended for high-risk patients. This recommendation follows the consensus statement: Guidelines for Management of Incidental Pulmonary Nodules Detected on CT Images: From the Fleischner Society 2017; Radiology 2017; 284:228-243. Electronically Signed   By: Constance Holster M.D.   On: 08/21/2018 20:55    ____________________________________________   PROCEDURES  Procedure(s) performed (including Critical Care): Critical care time 35 minutes.  This includes discussing the patient with our surgeon with the ultrasound tech with interventional radiology and [facility].  I also spent a good bit of time talking to the patient and monitoring her progress.  Procedures   ____________________________________________   INITIAL IMPRESSION / ASSESSMENT AND PLAN / ED COURSE  Gust the patient with our surgeon who wants to send the patient down to [facility].  I discussed the patient with Dr. Ronny Flurry back the surgeon at [facility] who did the surgery.  He feels that the best thing to do would be to just aspirate this under ultrasound guidance.  Reviewing the CT though it looks like the abscess is right up against the peritoneum.  I think if I going all over too far I might actually infected abdominal cavity.  After discussing this with the patient we decided to take the third course of Dr. Wynne Dust wanted to do which would be to give her antibiotics and have her see him in the office at 9:00 in the morning where he will aspirated.  He says he does not all the time.  Additionally she has to arrange care for her children at home and has a number of other concerns.  She has pain medicine at home.  She will return here if she has any problems at all at home.  I also discussed the fact with her that the lipase is slightly elevated although the CT does not show  any pancreatic inflammation.         Clinical Course as of Aug 20 2205  Tue Aug 21, 2018  2053 Lipase(!): 99 [PM]    Clinical Course User Index [PM] Nena Polio, MD     ____________________________________________   FINAL CLINICAL IMPRESSION(S) / ED DIAGNOSES  Final diagnoses:  Abdominal wall abscess at site of surgical wound     ED Discharge Orders    None       Note:  This document was prepared using Dragon voice recognition software and may include unintentional dictation errors.    Nena Polio, MD 08/21/18 2208

## 2018-08-21 NOTE — ED Triage Notes (Signed)
C/O severe mid abdominal pain x 2 hours.  Patient anxious.  Attempted IV x 1, unsuccessful.  Patient has port and wishes that to be used.

## 2018-08-22 DIAGNOSIS — L02211 Cutaneous abscess of abdominal wall: Secondary | ICD-10-CM | POA: Insufficient documentation

## 2019-06-07 DIAGNOSIS — Z9889 Other specified postprocedural states: Secondary | ICD-10-CM | POA: Insufficient documentation

## 2019-07-09 DIAGNOSIS — M222X1 Patellofemoral disorders, right knee: Secondary | ICD-10-CM | POA: Insufficient documentation

## 2019-07-09 DIAGNOSIS — M222X2 Patellofemoral disorders, left knee: Secondary | ICD-10-CM | POA: Insufficient documentation

## 2020-03-30 ENCOUNTER — Other Ambulatory Visit: Payer: Self-pay | Admitting: Family

## 2020-08-02 ENCOUNTER — Other Ambulatory Visit: Payer: Self-pay

## 2020-08-02 ENCOUNTER — Emergency Department
Admission: EM | Admit: 2020-08-02 | Discharge: 2020-08-02 | Disposition: A | Discharge status: Home / Self Care | Payer: BLUE CROSS/BLUE SHIELD | Attending: Emergency Medicine | Admitting: Emergency Medicine

## 2020-08-02 ENCOUNTER — Emergency Department: Payer: BLUE CROSS/BLUE SHIELD

## 2020-08-02 ENCOUNTER — Encounter: Payer: Self-pay | Admitting: Emergency Medicine

## 2020-08-02 DIAGNOSIS — Z5321 Procedure and treatment not carried out due to patient leaving prior to being seen by health care provider: Secondary | ICD-10-CM | POA: Insufficient documentation

## 2020-08-02 DIAGNOSIS — R1110 Vomiting, unspecified: Secondary | ICD-10-CM | POA: Diagnosis not present

## 2020-08-02 DIAGNOSIS — Z853 Personal history of malignant neoplasm of breast: Secondary | ICD-10-CM | POA: Insufficient documentation

## 2020-08-02 DIAGNOSIS — R109 Unspecified abdominal pain: Secondary | ICD-10-CM | POA: Diagnosis not present

## 2020-08-02 LAB — COMPREHENSIVE METABOLIC PANEL
ALT: 17 U/L (ref 0–44)
AST: 23 U/L (ref 15–41)
Albumin: 3.9 g/dL (ref 3.5–5.0)
Alkaline Phosphatase: 91 U/L (ref 38–126)
Anion gap: 10 (ref 5–15)
BUN: 7 mg/dL (ref 6–20)
CO2: 21 mmol/L — ABNORMAL LOW (ref 22–32)
Calcium: 9.3 mg/dL (ref 8.9–10.3)
Chloride: 106 mmol/L (ref 98–111)
Creatinine, Ser: 0.62 mg/dL (ref 0.44–1.00)
GFR, Estimated: >60 mL/min (ref >60)
Glucose, Bld: 136 mg/dL — ABNORMAL HIGH (ref 70–99)
Potassium: 3.4 mmol/L — ABNORMAL LOW (ref 3.5–5.1)
Sodium: 137 mmol/L (ref 135–145)
Total Bilirubin: 0.8 mg/dL (ref 0.3–1.2)
Total Protein: 7.2 g/dL (ref 6.5–8.1)

## 2020-08-02 LAB — CBC
HCT: 44.8 % (ref 36.0–46.0)
Hemoglobin: 15.9 g/dL — ABNORMAL HIGH (ref 12.0–15.0)
MCH: 34.3 pg — ABNORMAL HIGH (ref 26.0–34.0)
MCHC: 35.5 g/dL (ref 30.0–36.0)
MCV: 96.8 fL (ref 80.0–100.0)
Platelets: 274 10*3/uL (ref 150–400)
RBC: 4.63 MIL/uL (ref 3.87–5.11)
RDW: 14.6 % (ref 11.5–15.5)
WBC: 12 10*3/uL — ABNORMAL HIGH (ref 4.0–10.5)
nRBC: 0 % (ref 0.0–0.2)

## 2020-08-02 LAB — LIPASE, BLOOD: Lipase: 28 U/L (ref 11–51)

## 2020-08-02 MED ORDER — IOHEXOL 300 MG/ML  SOLN
100.0000 mL | Freq: Once | INTRAMUSCULAR | Status: AC | PRN
  Administered 2020-08-02: 100 mL via INTRAVENOUS

## 2020-08-02 MED ORDER — DROPERIDOL 2.5 MG/ML IJ SOLN
2.5000 mg | Freq: Once | INTRAMUSCULAR | Status: AC
  Administered 2020-08-02: 2.5 mg via INTRAVENOUS
  Filled 2020-08-02: qty 2

## 2020-08-02 MED ORDER — HYDROMORPHONE HCL 1 MG/ML IJ SOLN: 1.0000 mg | Freq: Once | INTRAMUSCULAR | Status: DC

## 2020-08-02 NOTE — ED Notes (Signed)
Pt states that she does not want to stay for further testing or evaluation. Pt requesting to have IV removed so that she can leave.

## 2020-08-02 NOTE — ED Provider Notes (Signed)
Emergency Medicine Provider Triage Evaluation Note  [name] , a 45 y.o. female  was evaluated in triage.  Pt complains of severe central abdominal pain starting overnight associated with nausea and vomiting.  Review of Systems  Positive: Abdominal pain, nausea, vomiting Negative: Chest pain, shortness of breath, dysuria, diarrhea, constipation  Physical Exam  BP (!) 151/124 (BP Location: Left Arm)   Pulse 81   Resp 20   Ht 5\' 11"  (1.803 m)   SpO2 98%   BMI 32.78 kg/m  Gen:   Awake, complaining of significant abdominal pain Resp:  Normal effort, no accessory muscle use, clear to auscultation. MSK:   Moves extremities without difficulty. Other:  Abdomen, soft and diffusely tender with voluntary guarding.  Medical Decision Making  Medically screening exam initiated at 9:17 AM.  Appropriate orders placed.  [name] was informed that the remainder of the evaluation will be completed by another provider, this initial triage assessment does not replace that evaluation, and the importance of remaining in the ED until their evaluation is complete.  Patient with breast cancer currently in remission presenting with recurrent severe abdominal pain, exam is soft with diffuse tenderness and voluntary guarding. She states symptoms are not responsive to morphine or other pain medication at home, we will treat with droperidol and further assess with CT scan, labs are pending.   Blake Divine, MD 08/02/20 [phone]

## 2020-08-02 NOTE — ED Triage Notes (Signed)
Pt reports has been vomiting all night and now has severe stomach pain to her mid abd. Pt reports currently with breast cancer

## 2020-11-21 ENCOUNTER — Emergency Department: Payer: BC Managed Care – PPO

## 2020-11-21 ENCOUNTER — Emergency Department
Admission: EM | Admit: 2020-11-21 | Discharge: 2020-11-21 | Disposition: A | Discharge status: Home / Self Care | Payer: BC Managed Care – PPO | Attending: Emergency Medicine | Admitting: Emergency Medicine

## 2020-11-21 ENCOUNTER — Other Ambulatory Visit: Payer: Self-pay

## 2020-11-21 DIAGNOSIS — Z853 Personal history of malignant neoplasm of breast: Secondary | ICD-10-CM | POA: Insufficient documentation

## 2020-11-21 DIAGNOSIS — R1084 Generalized abdominal pain: Secondary | ICD-10-CM | POA: Diagnosis not present

## 2020-11-21 DIAGNOSIS — R112 Nausea with vomiting, unspecified: Secondary | ICD-10-CM | POA: Diagnosis not present

## 2020-11-21 DIAGNOSIS — R109 Unspecified abdominal pain: Secondary | ICD-10-CM | POA: Diagnosis present

## 2020-11-21 DIAGNOSIS — Z87891 Personal history of nicotine dependence: Secondary | ICD-10-CM | POA: Diagnosis not present

## 2020-11-21 LAB — URINALYSIS, COMPLETE (UACMP) WITH MICROSCOPIC
Bilirubin Urine: NEGATIVE
Glucose, UA: NEGATIVE mg/dL
Hgb urine dipstick: NEGATIVE
Ketones, ur: NEGATIVE mg/dL
Leukocytes,Ua: NEGATIVE
Nitrite: NEGATIVE
Protein, ur: 30 mg/dL — AB
Specific Gravity, Urine: 1.024 (ref 1.005–1.030)
pH: 5 (ref 5.0–8.0)

## 2020-11-21 LAB — COMPREHENSIVE METABOLIC PANEL
ALT: 14 U/L (ref 0–44)
AST: 26 U/L (ref 15–41)
Albumin: 4.2 g/dL (ref 3.5–5.0)
Alkaline Phosphatase: 107 U/L (ref 38–126)
Anion gap: 11 (ref 5–15)
BUN: 10 mg/dL (ref 6–20)
CO2: 24 mmol/L (ref 22–32)
Calcium: 9.7 mg/dL (ref 8.9–10.3)
Chloride: 103 mmol/L (ref 98–111)
Creatinine, Ser: 0.78 mg/dL (ref 0.44–1.00)
GFR, Estimated: >60 mL/min (ref >60)
Glucose, Bld: 131 mg/dL — ABNORMAL HIGH (ref 70–99)
Potassium: 4.5 mmol/L (ref 3.5–5.1)
Sodium: 138 mmol/L (ref 135–145)
Total Bilirubin: 1 mg/dL (ref 0.3–1.2)
Total Protein: 7.6 g/dL (ref 6.5–8.1)

## 2020-11-21 LAB — CBC
HCT: 43.2 % (ref 36.0–46.0)
Hemoglobin: 15.7 g/dL — ABNORMAL HIGH (ref 12.0–15.0)
MCH: 36.1 pg — ABNORMAL HIGH (ref 26.0–34.0)
MCHC: 36.3 g/dL — ABNORMAL HIGH (ref 30.0–36.0)
MCV: 99.3 fL (ref 80.0–100.0)
Platelets: 268 10*3/uL (ref 150–400)
RBC: 4.35 MIL/uL (ref 3.87–5.11)
RDW: 14.2 % (ref 11.5–15.5)
WBC: 10.8 10*3/uL — ABNORMAL HIGH (ref 4.0–10.5)
nRBC: 0 % (ref 0.0–0.2)

## 2020-11-21 LAB — POC URINE PREG, ED: Preg Test, Ur: NEGATIVE — NL

## 2020-11-21 LAB — LIPASE, BLOOD: Lipase: 28 U/L (ref 11–51)

## 2020-11-21 MED ORDER — FAMOTIDINE IN NACL 20-0.9 MG/50ML-% IV SOLN
20.0000 mg | Freq: Once | INTRAVENOUS | Status: AC
  Administered 2020-11-21: 20 mg via INTRAVENOUS
  Filled 2020-11-21: qty 50

## 2020-11-21 MED ORDER — HALOPERIDOL LACTATE 5 MG/ML IJ SOLN
5.0000 mg | Freq: Once | INTRAMUSCULAR | Status: AC
  Administered 2020-11-21: 5 mg via INTRAVENOUS
  Filled 2020-11-21: qty 1

## 2020-11-21 MED ORDER — KETOROLAC TROMETHAMINE 30 MG/ML IJ SOLN
15.0000 mg | Freq: Once | INTRAMUSCULAR | Status: AC
  Administered 2020-11-21: 15 mg via INTRAVENOUS
  Filled 2020-11-21: qty 1

## 2020-11-21 MED ORDER — SODIUM CHLORIDE 0.9 % IV BOLUS
1000.0000 mL | Freq: Once | INTRAVENOUS | Status: AC
  Administered 2020-11-21: 1000 mL via INTRAVENOUS

## 2020-11-21 MED ORDER — MORPHINE SULFATE ER 15 MG PO TBCR: 15.0000 mg | EXTENDED_RELEASE_TABLET | Freq: Two times a day (BID) | ORAL | Status: DC

## 2020-11-21 MED ORDER — HYDROMORPHONE HCL 1 MG/ML IJ SOLN
INTRAMUSCULAR | Status: AC
  Administered 2020-11-21: 1 mg via INTRAVENOUS
  Filled 2020-11-21: qty 1

## 2020-11-21 MED ORDER — HYDROMORPHONE HCL 1 MG/ML IJ SOLN
1.0000 mg | INTRAMUSCULAR | Status: DC | PRN
  Administered 2020-11-21: 1 mg via INTRAVENOUS
  Filled 2020-11-21: qty 1

## 2020-11-21 MED ORDER — OXYCODONE HCL 5 MG PO TABS
15.0000 mg | ORAL_TABLET | ORAL | Status: DC | PRN
  Administered 2020-11-21: 15 mg via ORAL
  Filled 2020-11-21: qty 3

## 2020-11-21 MED ORDER — ONDANSETRON HCL 4 MG/2ML IJ SOLN
4.0000 mg | Freq: Once | INTRAMUSCULAR | Status: AC
  Administered 2020-11-21: 4 mg via INTRAVENOUS
  Filled 2020-11-21: qty 2

## 2020-11-21 MED ORDER — IOHEXOL 350 MG/ML SOLN
80.0000 mL | Freq: Once | INTRAVENOUS | Status: AC | PRN
  Administered 2020-11-21: 80 mL via INTRAVENOUS

## 2020-11-21 MED ORDER — MORPHINE SULFATE ER 15 MG PO TBCR
15.0000 mg | EXTENDED_RELEASE_TABLET | Freq: Two times a day (BID) | ORAL | Status: DC
  Administered 2020-11-21: 15 mg via ORAL
  Filled 2020-11-21: qty 1

## 2020-11-21 NOTE — ED Provider Notes (Addendum)
Patient received in signout from Dr. Darden Dates pending blood work as well as CT imaging presenting with quite a bit of generalized abdominal pain.  Has had similar symptoms in the past with reassuring work-up.  Blood work with mild leukocytosis CT imaging without clear acute abnormality.  Does not seem consistent with ischemic colitis.  She is not having any diarrhea.  Pain is generalized she is not having guarding or rebound tenderness not consistent with perforation or obstruction.  She is on quite a bit of chronic narcotic pain medication.  Will order her home meds trial p.o. and if she still having pain may require hospitalization.   Merlyn Lot, MD 11/21/20 1725  Patient feeling improved and at her baseline after receiving her home meds.  We discussed admission to the hospital for further pain medication fluids and observation.  States that she would prefer to try managing this at home.  Given her overall reassuring work-up now that she is tolerating p.o. and is feeling better I think that is reasonable.  I encouraged her to return to the ER should she have trouble controlling her pain or symptoms recur.  She  and her husband are agreeable to plan.    Merlyn Lot, MD 11/21/20 [phone]

## 2020-11-21 NOTE — ED Triage Notes (Signed)
Pt states that she started having abd pain this morning around 10-11 AM- pt states pain is all over- pt has had n/v but denies diarrhea- pt has had her gallbladder and appendix removed- pt screaming in triage room and not sitting still for [place] to do EKG or vitals

## 2020-11-21 NOTE — ED Provider Notes (Signed)
[facility]  ____________________________________________   Event Date/Time   First MD Initiated Contact with Patient 11/21/20 1416     (approximate)  I have reviewed the triage vital signs and the nursing notes.   HISTORY  Chief Complaint Abdominal Pain    HPI [name] is a 45 y.o. female past medical history of breast cancer presents with abdominal pain.  Symptoms started around 10 AM today.  Pain is diffuse, poorly localized.  Is a constant burning pain.  She has nausea and vomiting but no associated diarrhea or constipation.  She denies fevers chills.  No urinary symptoms.  Tells me that she has had this about 3-4 times in the past and has always had normal imaging and abnormalities have been discovered.  Patient tried Zofran at home with no improvement in symptoms.         Past Medical History:  Diagnosis Date   Cancer [facility])    Breast   DJD (degenerative joint disease)    Thyroid disease     Patient Active Problem List   Diagnosis Date Noted   TOBACCO ABUSE 05/21/2007   GERD 05/21/2007   DISORDER, EPISODIC MOOD NOS 10/30/2006   GOITER NOS 04/13/2006   OBESITY, NOS 04/13/2006   DEPRESSION, MAJOR, RECURRENT 04/13/2006    History reviewed. No pertinent surgical history.  Prior to Admission medications   Medication Sig Start Date End Date Taking? Authorizing Provider  azithromycin (ZITHROMAX Z-PAK) 250 MG tablet Take 2 tablets (500 mg) on  Day 1,  followed by 1 tablet (250 mg) once daily on Days 2 through 5. 04/24/15   Menshew, Dannielle Karvonen, PA-C  benzonatate (TESSALON PERLES) 100 MG capsule Take 1 capsule (100 mg total) by mouth 3 (three) times daily as needed for cough (Take 1-2 per dose). 04/24/15   Menshew, Dannielle Karvonen, PA-C  dicyclomine (BENTYL) 10 MG capsule Take 1 capsule (10 mg total) by mouth 4 (four) times daily for 14 days. 05/19/18 06/02/18  Cuthriell, Charline Bills, PA-C  fluticasone (FLONASE) 50 MCG/ACT nasal spray Place 2  sprays into both nostrils daily. 04/24/15   Menshew, Dannielle Karvonen, PA-C  ondansetron (ZOFRAN-ODT) 4 MG disintegrating tablet Take 1 tablet (4 mg total) by mouth every 8 (eight) hours as needed for nausea or vomiting. 05/19/18   Cuthriell, Charline Bills, PA-C  predniSONE (DELTASONE) 50 MG tablet Take one tablet by mouth daily for the next five days. 08/18/16   Lannie Fields, PA-C    Allergies Buspirone  No family history on file.  Social History Social History   Tobacco Use   Smoking status: Former   Smokeless tobacco: Never  Substance Use Topics   Alcohol use: No   Drug use: No    Review of Systems   Review of Systems  Constitutional:  Negative for chills and fever.  Respiratory:  Negative for shortness of breath.   Cardiovascular:  Negative for chest pain.  Gastrointestinal:  Positive for abdominal pain, nausea and vomiting. Negative for constipation and diarrhea.  Genitourinary:  Negative for dysuria.  All other systems reviewed and are negative.  Physical Exam Updated Vital Signs BP 126/89   Pulse 64   Temp 98 F (36.7 C) (Oral)   Resp 19   Ht 5\' 11"  (1.803 m)   Wt 93 kg   SpO2 98%   BMI 28.59 kg/m   Physical Exam Vitals and nursing note reviewed.  Constitutional:      General: She is in  acute distress.     Appearance: Normal appearance.     Comments: Patient is screaming in pain, inconsolable  HENT:     Head: Normocephalic and atraumatic.  Eyes:     General: No scleral icterus.    Conjunctiva/sclera: Conjunctivae normal.  Pulmonary:     Effort: Pulmonary effort is normal. No respiratory distress.     Breath sounds: No stridor.  Abdominal:     General: Abdomen is flat.     Palpations: Abdomen is soft.     Tenderness: There is abdominal tenderness.     Comments: Abdomen is soft throughout, there is tenderness diffusely  Musculoskeletal:        General: No deformity or signs of injury.     Cervical back: Normal range of motion.  Skin:    General: Skin is  dry.     Coloration: Skin is not jaundiced or pale.  Neurological:     General: No focal deficit present.     Mental Status: She is alert and oriented to person, place, and time. Mental status is at baseline.  Psychiatric:        Mood and Affect: Mood normal.        Behavior: Behavior normal.     LABS (all labs ordered are listed, but only abnormal results are displayed)  Labs Reviewed  COMPREHENSIVE METABOLIC PANEL - Abnormal; Notable for the following components:      Result Value   Glucose, Bld 131 (*)    All other components within normal limits  CBC - Abnormal; Notable for the following components:   WBC 10.8 (*)    Hemoglobin 15.7 (*)    MCH 36.1 (*)    MCHC 36.3 (*)    All other components within normal limits  LIPASE, BLOOD  URINALYSIS, COMPLETE (UACMP) WITH MICROSCOPIC  POC URINE PREG, ED   ____________________________________________  EKG NSR, nml axis, nml intervals, no acute ischemic changes  ____________________________________________  RADIOLOGY Almeta Monas, personally viewed and evaluated these images (plain radiographs) as part of my medical decision making, as well as reviewing the written report by the radiologist.  ED MD interpretation: CT abdomen pelvis pending at the time of signout    ____________________________________________   PROCEDURES  Procedure(s) performed (including Critical Care):  Procedures   ____________________________________________   INITIAL IMPRESSION / ASSESSMENT AND PLAN / ED COURSE     Patient is a 45 year old female presenting with abdominal pain times several hours.  On arrival patient is in significant distress, she is screaming and almost inconsolable.  Vital signs however are within normal limits, she is not tachycardic or hypertensive.  Abdomen overall benign but she does have subjective tenderness throughout.  Patient tells me this is happened several times in the past and there is no source been  identified.  Has not had any recent visits to the ED.  Try Haldol and Pepcid and fluids.  We will get labs and a CT abdomen pelvis.  At the time of signout patient is pending labs and her imaging and reassessment.      ____________________________________________   FINAL CLINICAL IMPRESSION(S) / ED DIAGNOSES  Final diagnoses:  Generalized abdominal pain     ED Discharge Orders     None        Note:  This document was prepared using Dragon voice recognition software and may include unintentional dictation errors.    Rada Hay, MD 11/21/20 [phone]

## 2020-11-21 NOTE — Discharge Instructions (Addendum)

## 2021-03-04 ENCOUNTER — Observation Stay
Admission: EM | Admit: 2021-03-04 | Discharge: 2021-03-06 | Disposition: A | Discharge status: Home / Self Care | Payer: BC Managed Care – PPO | Attending: Internal Medicine | Admitting: Internal Medicine

## 2021-03-04 DIAGNOSIS — Z87891 Personal history of nicotine dependence: Secondary | ICD-10-CM | POA: Diagnosis not present

## 2021-03-04 DIAGNOSIS — Z20822 Contact with and (suspected) exposure to covid-19: Secondary | ICD-10-CM | POA: Insufficient documentation

## 2021-03-04 DIAGNOSIS — R1115 Cyclical vomiting syndrome unrelated to migraine: Secondary | ICD-10-CM | POA: Diagnosis not present

## 2021-03-04 DIAGNOSIS — Z79891 Long term (current) use of opiate analgesic: Secondary | ICD-10-CM | POA: Diagnosis not present

## 2021-03-04 DIAGNOSIS — R7402 Elevation of levels of lactic acid dehydrogenase (LDH): Secondary | ICD-10-CM | POA: Insufficient documentation

## 2021-03-04 DIAGNOSIS — Z9851 Tubal ligation status: Secondary | ICD-10-CM | POA: Insufficient documentation

## 2021-03-04 DIAGNOSIS — R1084 Generalized abdominal pain: Secondary | ICD-10-CM | POA: Diagnosis present

## 2021-03-04 DIAGNOSIS — K2900 Acute gastritis without bleeding: Secondary | ICD-10-CM | POA: Diagnosis not present

## 2021-03-04 DIAGNOSIS — Z945 Skin transplant status: Secondary | ICD-10-CM

## 2021-03-04 DIAGNOSIS — Z853 Personal history of malignant neoplasm of breast: Secondary | ICD-10-CM | POA: Insufficient documentation

## 2021-03-04 DIAGNOSIS — Z9013 Acquired absence of bilateral breasts and nipples: Secondary | ICD-10-CM

## 2021-03-04 DIAGNOSIS — F39 Unspecified mood [affective] disorder: Secondary | ICD-10-CM

## 2021-03-04 DIAGNOSIS — R112 Nausea with vomiting, unspecified: Secondary | ICD-10-CM

## 2021-03-04 NOTE — ED Notes (Signed)
Provider at bedside at this time

## 2021-03-04 NOTE — ED Triage Notes (Addendum)
Pt to triage via w/c, moaning loudly, restless, screaming out; c/o generalized stabbing pain to abd x 5-6 hrs accomp by N/V; Tuesday had reconstruction surgery to umbilical (hx of abscess to area), breast CA pt and st has been unable to take any of her pain meds as well; pt was at [facility] but left before being seen due to long wait

## 2021-03-05 ENCOUNTER — Emergency Department: Payer: BC Managed Care – PPO

## 2021-03-05 ENCOUNTER — Other Ambulatory Visit: Payer: Self-pay

## 2021-03-05 DIAGNOSIS — Z945 Skin transplant status: Secondary | ICD-10-CM

## 2021-03-05 DIAGNOSIS — K2900 Acute gastritis without bleeding: Principal | ICD-10-CM

## 2021-03-05 DIAGNOSIS — R1115 Cyclical vomiting syndrome unrelated to migraine: Secondary | ICD-10-CM

## 2021-03-05 DIAGNOSIS — Z79891 Long term (current) use of opiate analgesic: Secondary | ICD-10-CM

## 2021-03-05 DIAGNOSIS — R1084 Generalized abdominal pain: Secondary | ICD-10-CM

## 2021-03-05 DIAGNOSIS — R112 Nausea with vomiting, unspecified: Secondary | ICD-10-CM

## 2021-03-05 DIAGNOSIS — Z9013 Acquired absence of bilateral breasts and nipples: Secondary | ICD-10-CM

## 2021-03-05 LAB — CBC WITH DIFFERENTIAL/PLATELET
Abs Immature Granulocytes: 0.05 10*3/uL (ref 0.00–0.07)
Basophils Absolute: 0 10*3/uL (ref 0.0–0.1)
Basophils Relative: 0 %
Eosinophils Absolute: 0.4 10*3/uL (ref 0.0–0.5)
Eosinophils Relative: 3 %
HCT: 43.3 % (ref 36.0–46.0)
Hemoglobin: 15.2 g/dL — ABNORMAL HIGH (ref 12.0–15.0)
Immature Granulocytes: 1 %
Lymphocytes Relative: 23 %
Lymphs Abs: 2.6 10*3/uL (ref 0.7–4.0)
MCH: 34.3 pg — ABNORMAL HIGH (ref 26.0–34.0)
MCHC: 35.1 g/dL (ref 30.0–36.0)
MCV: 97.7 fL (ref 80.0–100.0)
Monocytes Absolute: 0.8 10*3/uL (ref 0.1–1.0)
Monocytes Relative: 7 %
Neutro Abs: 7.3 10*3/uL (ref 1.7–7.7)
Neutrophils Relative %: 66 %
Platelets: 258 10*3/uL (ref 150–400)
RBC: 4.43 MIL/uL (ref 3.87–5.11)
RDW: 14.8 % (ref 11.5–15.5)
WBC: 11.1 10*3/uL — ABNORMAL HIGH (ref 4.0–10.5)
nRBC: 0 % (ref 0.0–0.2)

## 2021-03-05 LAB — RESP PANEL BY RT-PCR (FLU A&B, COVID) ARPGX2
Influenza A by PCR: NEGATIVE
Influenza B by PCR: NEGATIVE
SARS Coronavirus 2 by RT PCR: NEGATIVE

## 2021-03-05 LAB — PROTIME-INR
INR: 0.9 (ref 0.8–1.2)
Prothrombin Time: 12.5 seconds (ref 11.4–15.2)

## 2021-03-05 LAB — LACTIC ACID, PLASMA
Lactic Acid, Venous: 2.2 mmol/L (ref 0.5–1.9)
Lactic Acid, Venous: 2.3 mmol/L (ref 0.5–1.9)
Lactic Acid, Venous: 2 mmol/L (ref 0.5–1.9)
Lactic Acid, Venous: 1.9 mmol/L (ref 0.5–1.9)

## 2021-03-05 LAB — HCG, QUANTITATIVE, PREGNANCY: hCG, Beta Chain, Quant, S: 7 m[IU]/mL — ABNORMAL HIGH (ref <5)

## 2021-03-05 LAB — COMPREHENSIVE METABOLIC PANEL
ALT: 12 U/L (ref 0–44)
AST: 27 U/L (ref 15–41)
Albumin: 4.7 g/dL (ref 3.5–5.0)
Alkaline Phosphatase: 85 U/L (ref 38–126)
Anion gap: 9 (ref 5–15)
BUN: 16 mg/dL (ref 6–20)
CO2: 23 mmol/L (ref 22–32)
Calcium: 9.6 mg/dL (ref 8.9–10.3)
Chloride: 106 mmol/L (ref 98–111)
Creatinine, Ser: 0.74 mg/dL (ref 0.44–1.00)
GFR, Estimated: >60 mL/min (ref >60)
Glucose, Bld: 131 mg/dL — ABNORMAL HIGH (ref 70–99)
Potassium: 4 mmol/L (ref 3.5–5.1)
Sodium: 138 mmol/L (ref 135–145)
Total Bilirubin: 1.2 mg/dL (ref 0.3–1.2)
Total Protein: 8.4 g/dL — ABNORMAL HIGH (ref 6.5–8.1)

## 2021-03-05 LAB — CBC
HCT: 39.2 % (ref 36.0–46.0)
Hemoglobin: 13.9 g/dL (ref 12.0–15.0)
MCH: 34.1 pg — ABNORMAL HIGH (ref 26.0–34.0)
MCHC: 35.5 g/dL (ref 30.0–36.0)
MCV: 96.1 fL (ref 80.0–100.0)
Platelets: 226 10*3/uL (ref 150–400)
RBC: 4.08 MIL/uL (ref 3.87–5.11)
RDW: 14.5 % (ref 11.5–15.5)
WBC: 12 10*3/uL — ABNORMAL HIGH (ref 4.0–10.5)
nRBC: 0 % (ref 0.0–0.2)

## 2021-03-05 LAB — LIPASE, BLOOD: Lipase: 30 U/L (ref 11–51)

## 2021-03-05 LAB — CREATININE, SERUM
Creatinine, Ser: 0.57 mg/dL (ref 0.44–1.00)
GFR, Estimated: >60 mL/min (ref >60)

## 2021-03-05 LAB — HIV ANTIBODY (ROUTINE TESTING W REFLEX): HIV Screen 4th Generation wRfx: NONREACTIVE

## 2021-03-05 MED ORDER — MORPHINE SULFATE ER 15 MG PO TBCR
15.0000 mg | EXTENDED_RELEASE_TABLET | Freq: Two times a day (BID) | ORAL | Status: DC
  Administered 2021-03-06: 15 mg via ORAL
  Filled 2021-03-05: qty 1

## 2021-03-05 MED ORDER — IOHEXOL 300 MG/ML  SOLN
100.0000 mL | Freq: Once | INTRAMUSCULAR | Status: AC | PRN
  Administered 2021-03-05: 100 mL via INTRAVENOUS

## 2021-03-05 MED ORDER — MORPHINE SULFATE ER 30 MG PO TBCR
30.0000 mg | EXTENDED_RELEASE_TABLET | Freq: Two times a day (BID) | ORAL | Status: DC
  Administered 2021-03-05: 30 mg via ORAL
  Filled 2021-03-05: qty 1

## 2021-03-05 MED ORDER — DIPHENHYDRAMINE HCL 50 MG/ML IJ SOLN
12.5000 mg | INTRAMUSCULAR | Status: AC
  Administered 2021-03-05: 12.5 mg via INTRAVENOUS
  Filled 2021-03-05: qty 1

## 2021-03-05 MED ORDER — SODIUM CHLORIDE 0.9 % IV SOLN: INTRAVENOUS | Status: DC

## 2021-03-05 MED ORDER — PROCHLORPERAZINE EDISYLATE 10 MG/2ML IJ SOLN
10.0000 mg | INTRAMUSCULAR | Status: DC | PRN
  Filled 2021-03-05: qty 2

## 2021-03-05 MED ORDER — OXYCODONE HCL 5 MG PO TABS
15.0000 mg | ORAL_TABLET | ORAL | Status: DC | PRN
  Administered 2021-03-05 – 2021-03-06 (×3): 15 mg via ORAL
  Filled 2021-03-05 (×3): qty 3

## 2021-03-05 MED ORDER — LACTATED RINGERS IV BOLUS
1000.0000 mL | Freq: Once | INTRAVENOUS | Status: AC
  Administered 2021-03-05: 1000 mL via INTRAVENOUS

## 2021-03-05 MED ORDER — METOCLOPRAMIDE HCL 5 MG/ML IJ SOLN
10.0000 mg | INTRAMUSCULAR | Status: AC
  Administered 2021-03-05: 10 mg via INTRAVENOUS
  Filled 2021-03-05: qty 2

## 2021-03-05 MED ORDER — DROPERIDOL 2.5 MG/ML IJ SOLN
2.5000 mg | Freq: Once | INTRAMUSCULAR | Status: AC
  Administered 2021-03-05: 2.5 mg via INTRAVENOUS
  Filled 2021-03-05: qty 2

## 2021-03-05 MED ORDER — MORPHINE SULFATE (PF) 2 MG/ML IV SOLN
2.0000 mg | INTRAVENOUS | Status: DC | PRN
  Administered 2021-03-05: 2 mg via INTRAVENOUS
  Filled 2021-03-05: qty 1

## 2021-03-05 MED ORDER — ONDANSETRON HCL 4 MG PO TABS: 4.0000 mg | ORAL_TABLET | Freq: Four times a day (QID) | ORAL | Status: DC | PRN

## 2021-03-05 MED ORDER — ACETAMINOPHEN 650 MG RE SUPP: 650.0000 mg | Freq: Four times a day (QID) | RECTAL | Status: DC | PRN

## 2021-03-05 MED ORDER — KETOROLAC TROMETHAMINE 30 MG/ML IJ SOLN
15.0000 mg | Freq: Once | INTRAMUSCULAR | Status: AC
  Administered 2021-03-05: 15 mg via INTRAVENOUS
  Filled 2021-03-05: qty 1

## 2021-03-05 MED ORDER — PANTOPRAZOLE SODIUM 40 MG IV SOLR
40.0000 mg | INTRAVENOUS | Status: DC
  Administered 2021-03-05 – 2021-03-06 (×2): 40 mg via INTRAVENOUS
  Filled 2021-03-05 (×2): qty 40

## 2021-03-05 MED ORDER — ENOXAPARIN SODIUM 40 MG/0.4ML IJ SOSY
40.0000 mg | PREFILLED_SYRINGE | INTRAMUSCULAR | Status: DC
  Administered 2021-03-05: 40 mg via SUBCUTANEOUS
  Filled 2021-03-05 (×2): qty 0.4

## 2021-03-05 MED ORDER — MORPHINE SULFATE (PF) 4 MG/ML IV SOLN
4.0000 mg | Freq: Once | INTRAVENOUS | Status: AC
  Administered 2021-03-05: 4 mg via INTRAVENOUS
  Filled 2021-03-05: qty 1

## 2021-03-05 MED ORDER — ACETAMINOPHEN 325 MG PO TABS: 650.0000 mg | ORAL_TABLET | Freq: Four times a day (QID) | ORAL | Status: DC | PRN

## 2021-03-05 MED ORDER — ONDANSETRON HCL 4 MG/2ML IJ SOLN
4.0000 mg | Freq: Four times a day (QID) | INTRAMUSCULAR | Status: DC | PRN
  Administered 2021-03-05: 4 mg via INTRAVENOUS
  Filled 2021-03-05: qty 2

## 2021-03-05 NOTE — Care Management Obs Status (Signed)
[place] NOTIFICATION   Patient Details  Name: [name] MRN: [id number] Date of Birth: 12-27-1975   Medicare Observation Status Notification Given:  Yes    Conception Oms, RN 03/05/2021, 10:00 AM

## 2021-03-05 NOTE — TOC Initial Note (Signed)
Transition of Care [facility]) - Initial/Assessment Note    Patient Details  Name: [name] MRN: [id number] Date of Birth: 03-25-1975  Transition of Care [facility]) CM/SW Contact:    Conception Oms, RN Phone Number: 03/05/2021, 9:44 AM  Clinical Narrative:                  Transition of Care [facility]) Screening Note   Patient Details  Name: [name] Date of Birth: [birth year]   [facility] of [facility]) CM/SW Contact:    Conception Oms, RN Phone Number: 03/05/2021, 9:44 AM    Transition of Care Department [facility]) has reviewed patient and no TOC needs have been identified at this time. We will continue to monitor patient advancement through interdisciplinary progression rounds. If new patient transition needs arise, please place a TOC consult.          Patient Goals and CMS Choice        Expected Discharge Plan and Services                                                Prior Living Arrangements/Services                       Activities of Daily Living      Permission Sought/Granted                  Emotional Assessment              Admission diagnosis:  Generalized abdominal pain [K34.91] Cyclic vomiting syndrome [R11.15] Intractable vomiting [R11.10] Intractable nausea and vomiting [R11.2] Patient Active Problem List   Diagnosis Date Noted   H/O bilateral mastectomy 03/05/2021   S/P split thickness skin graft //17/2023 03/05/2021   Acute gastritis 03/05/2021   Chronic prescription opiate use 03/05/2021   Patellofemoral pain syndrome of both knees 07/09/2019   Other specified postprocedural states 06/07/2019   Abdominal wall abscess 08/22/2018   Neuropathy 10/03/2017   Radicular leg pain 10/03/2017   Malignant neoplasm of upper-inner quadrant of right breast in female, estrogen receptor negative ([place]) 08/14/2017   Bipolar disorder, in partial remission, most recent episode depressed ([place]) 10/04/2016   Anxiety  07/04/2016   TOBACCO ABUSE 05/21/2007   GERD 05/21/2007   Mood disorder ([place]) 10/30/2006   GOITER NOS 04/13/2006   OBESITY, NOS 04/13/2006   DEPRESSION, MAJOR, RECURRENT 04/13/2006   Depression, major, recurrent ([place]) 04/13/2006   Class 1 obesity due to excess calories with serious comorbidity and body mass index (BMI) of 33.0 to 33.9 in adult 04/13/2006   PCP:  [facility]:   [id number] [place], [place] [address] Phone: [phone] Fax: [phone]     Social Determinants of Health (SDOH) Interventions    Readmission Risk Interventions No flowsheet data found.

## 2021-03-05 NOTE — ED Provider Notes (Signed)
[facility] Provider Note    Event Date/Time   First MD Initiated Contact with Patient 03/04/21 2328     (approximate)   History   Abdominal Pain   HPI  [name] is a 46 y.o. female whose extensive past medical history includes, but is not limited to, history of breast cancer status postchemotherapy (no longer on treatment) and status post breast reconstruction, prior abdominal wall abscess requiring recent reconstructive surgery about 3 days ago at [facility], bipolar disorder, anxiety, depression, and multiple chronic pain syndromes.  She presents tonight for evaluation of a cute onset and severe sharp stabbing generalized abdominal pain.  She says that this occurs about once a month.  She believes it is still her gallbladder although her gallbladder has been removed years ago, but she states that "they have had to go back 3 times to did get out more stones".  She goes to both [facility] and [facility] for treatment although she was most recently at [facility] for her lower abdominal surgery.   The pain started at some point in the early afternoon and has been constant.  She says it feels the same as it does every month and no one can tell her why it is happening.  She has been vomiting repeatedly and "I can't keep down my medicines so I am in a lot of pain".  She describes taking 2 different types of morphine as well as oxycodone for her chronic pain syndromes associated with cancer and other issues.  She said that the pain started acutely after having a bowel movement today.  She has no history of bowel obstruction.  She has had no recent fever.  She said that her abdominal wall feels okay and that it has not been bleeding or leaking through the postoperative bandage.  This feels similar to her prior episodes.  She has had no chest pain or shortness of breath although the pain takes her breath away.  Patient denies marijuana or other drug use other than the opiates that she is  prescribed.     Physical Exam   Triage Vital Signs: ED Triage Vitals  Enc Vitals Group     BP 03/04/21 2317 (!) 130/116     Pulse Rate 03/04/21 2317 69     Resp 03/04/21 2317 20     Temp 03/04/21 2317 98.2 F (36.8 C)     Temp Source 03/04/21 2317 Oral     SpO2 03/04/21 2317 98 %     Weight 03/04/21 2317 92.5 kg (204 lb)     Height 03/04/21 2317 1.803 m (5\' 11" )     Head Circumference --      Peak Flow --      Pain Score 03/04/21 2322 10     Pain Loc --      Pain Edu? --      Excl. in [place]? --     Most recent vital signs: Vitals:   03/05/21 0230 03/05/21 0356  BP:  (!) 141/57  Pulse: 68 75  Resp: 20   Temp:  98.1 F (36.7 C)  SpO2: 100% 98%     General: Awake, standing on the side of the bed, bending over it and moaning very loudly though she will redirect when spoken to. CV:  Good peripheral perfusion.  Resp:  Normal effort.  Speaking in clear, loud voice without any respiratory difficulty. Abd:  No distention.  Postoperative bandages in place below the umbilicus with no excessive  drainage and no visible bleeding.  There is minimal tenderness to palpation throughout and the patient actually indicates less pain and moans less during the physical exam than when we are just talking or when she is left alone in the room.  No peritonitis.    ED Results / Procedures / Treatments   Labs (all labs ordered are listed, but only abnormal results are displayed) Labs Reviewed  LACTIC ACID, PLASMA - Abnormal; Notable for the following components:      Result Value   Lactic Acid, Venous 2.2 (*)    All other components within normal limits  LACTIC ACID, PLASMA - Abnormal; Notable for the following components:   Lactic Acid, Venous 2.3 (*)    All other components within normal limits  COMPREHENSIVE METABOLIC PANEL - Abnormal; Notable for the following components:   Glucose, Bld 131 (*)    Total Protein 8.4 (*)    All other components within normal limits  CBC WITH  DIFFERENTIAL/PLATELET - Abnormal; Notable for the following components:   WBC 11.1 (*)    Hemoglobin 15.2 (*)    MCH 34.3 (*)    All other components within normal limits  HCG, QUANTITATIVE, PREGNANCY - Abnormal; Notable for the following components:   hCG, Beta Chain, Quant, S 7 (*)    All other components within normal limits  RESP PANEL BY RT-PCR (FLU A&B, COVID) ARPGX2  PROTIME-INR  LIPASE, BLOOD  HIV ANTIBODY (ROUTINE TESTING W REFLEX)  CBC  CREATININE, SERUM  POC URINE PREG, ED     EKG  I reviewed one of the patient's prior EKGs from 11/21/2020 and verify that she has a normal QTc interval.  No indication for new EKG tonight.   RADIOLOGY Unremarkable CT of the abdomen and pelvis other than anticipated postsurgical changes; see ED course for additional details.    PROCEDURES:  Critical Care performed: No  .1-3 Lead EKG Interpretation Performed by: Hinda Kehr, MD Authorized by: Hinda Kehr, MD     Interpretation: normal     ECG rate:  68   ECG rate assessment: normal     Rhythm: sinus rhythm     Ectopy: none     Conduction: normal     MEDICATIONS ORDERED IN ED: Medications  enoxaparin (LOVENOX) injection 40 mg (has no administration in time range)  0.9 %  sodium chloride infusion (has no administration in time range)  acetaminophen (TYLENOL) tablet 650 mg (has no administration in time range)    Or  acetaminophen (TYLENOL) suppository 650 mg (has no administration in time range)  morphine 2 MG/ML injection 2 mg (has no administration in time range)  ondansetron (ZOFRAN) tablet 4 mg (has no administration in time range)    Or  ondansetron (ZOFRAN) injection 4 mg (has no administration in time range)  prochlorperazine (COMPAZINE) injection 10 mg (has no administration in time range)  pantoprazole (PROTONIX) injection 40 mg (has no administration in time range)  droperidol (INAPSINE) 2.5 MG/ML injection 2.5 mg (2.5 mg Intravenous Given 03/05/21 0024)   ketorolac (TORADOL) 30 MG/ML injection 15 mg (15 mg Intravenous Given 03/05/21 0023)  diphenhydrAMINE (BENADRYL) injection 12.5 mg (12.5 mg Intravenous Given 03/05/21 0022)  lactated ringers bolus 1,000 mL (0 mLs Intravenous Stopped 03/05/21 0357)  iohexol (OMNIPAQUE) 300 MG/ML solution 100 mL (100 mLs Intravenous Contrast Given 03/05/21 0136)  droperidol (INAPSINE) 2.5 MG/ML injection 2.5 mg (2.5 mg Intravenous Given 03/05/21 0206)  morphine 4 MG/ML injection 4 mg (4 mg Intravenous Given 03/05/21 0424)  metoCLOPramide (REGLAN) injection 10 mg (10 mg Intravenous Given 03/05/21 0423)  lactated ringers bolus 1,000 mL (1,000 mLs Intravenous New Bag/Given 03/05/21 0429)     IMPRESSION / MDM / ASSESSMENT AND PLAN / ED COURSE  I reviewed the triage vital signs and the nursing notes.                              Differential diagnosis includes, but is not limited to, cyclic vomiting syndrome, cannabinoid hyperemesis syndrome, acute on chronic abdominal pain, SBO/ileus, postoperative pain or complication, diverticulitis, retained stone leading to choledocholithiasis.    Based on the presentation I strongly suspect cyclic vomiting syndrome or cannabinoid hyperemesis syndrome, although the patient denies marijuana use.  Her pain while at rest and not being spoken to her exam and seems out of proportion, but she is able to redirect and speak with me and physical exam including abdominal palpation seems to elicit minimal tenderness.  No peritonitis.  Patient's vital signs are notable for some diastolic hypertension but and spite of her pain she has a normal heart rate and respiratory rate.  Normal oxygenation.  I reviewed the patient's operative note from [facility] from 03/02/2021 involving tissue transfer or rearrangement on the lower abdomen.  I also reviewed an oncology visit from 02/25/2021 from [place].  I will treat for probable cyclic vomiting syndrome with droperidol 2.5 mg IV, as well as diphenhydramine 12.5 mg  IV to try to reduce the possibility of a dystonic reaction.  The patient is on the cardiac monitor to evaluate for evidence of arrhythmia and/or significant heart rate changes.  Lab work ordered includes CBC with differential, hCG (as the patient is unlikely to provide a urine specimen), CMP, lipase, and lactic acid. Anticipate obtaining CT abd/pelvis after verifying renal function and no pregnancy.    Clinical Course as of 03/05/21 0525  Fri Mar 05, 2021  0040 CBC WITH DIFFERENTIAL(!) CBC essentially normal though she has a slightly elevated hemoglobin and a mild leukocytosis, both of which could indicate some volume contraction due to all the vomiting.  I am ordering 1 L LR IV bolus. [CF]  0044 HCG, Beta Chain, Quant, S(!): 7 Difficult to interpret a beta-hCG of 7.  This likely does not represent pregnancy, particularly given the patient's ongoing medical issues, recent surgeries, cancer and history of chemotherapy, etc.  I will discussed with the patient prior to imaging, however. [CF]  0046 Comprehensive metabolic panel(!) Reassuring comprehensive metabolic panel with no elevation of LFTs, normal kidney function, normal anion gap. [CF]  [phone] Patient is much more comfortable, resting quietly and in no apparent distress.  She states that she feels much better even though she still has "a little bit of pain".  I asked her about the beta-hCG and she confirmed that she has had a bilateral tubal ligation.  I think this is an erroneous very slight elevation above the upper limit of normal/negative.  I will proceed with a CT scan given her complicated medical history and recent surgery although anticipate will likely be negative. [CF]  [phone] Now that the patient is coming back from CT scan, she is again moaning loudly and discomfort.  I will give her another dose of droperidol 2.5 mg IV which worked very well previously while we await CT results. [CF]  0208 Lipase: 30 within normal limits [CF]  0208 CT  ABDOMEN PELVIS W CONTRAST Reassuring CT scan of the abdomen and pelvis.  There is some anticipated postoperative changes around the umbilicus but no evidence of acute infection or abscess, no biliary ductal dilatation, no evidence of obstruction.  I will allow the second dose of droperidol to work and I anticipate talking with the patient and her husband about discharge with a prescription for Reglan. [CF]  0209 I reviewed the patient's [place] controlled substance database record and verified that she recently picked up prescription for oxycodone and also has prescriptions for 2 different formulations of morphine, as she disclosed to me during our initial discussion. [CF]  0247 Lactic Acid, Venous(!!): 2.2 Very mild lactic acid elevation which I believe is due to her vomiting and volume contraction/dehydration rather than acute infection.  I will repeat after her fluid bolus. [CF]  1610 Patient's IV fluids are done, we will proceed with repeat lactic acid and a p.o. challenge.  Anticipate discharge. [CF]  0400 I reassessed the patient.  She is getting increasingly more agitated, moaning loudly saying the pain is worse than ever.  I suspect there may be an element of narcotic withdrawal at this point because she has not been able to tolerate any of her medications for probably at least 8 hours, certainly the almost 5 hours that she has been in the emergency department.  I have been unsuccessful in treating the acute on chronic pain and the intractable nausea and vomiting.  Additionally her repeat lactic acid has gone up to 2.3, showing no improvement and in fact a slight worsening in spite of the liter of fluids.  At this point I think it is appropriate to admit her to the hospital service.  I will consult the hospitalist for admission.  Given the probability that there is at least some degree of narcotic withdrawal, I will now try treating her with morphine 4 mg IV and Reglan 10 mg IV.  Patient and her  husband understand and agree with the plan.  She stated that she does not want to go home because she does not think she can take any of her regular medications. [CF]  9604 Consulted Dr. Damita Dunnings with the hospitalist service by secure chat text and she will admit the patient. [CF]    Clinical Course User Index [CF] Hinda Kehr, MD     FINAL CLINICAL IMPRESSION(S) / ED DIAGNOSES   Final diagnoses:  Cyclic vomiting syndrome  Intractable nausea and vomiting  Generalized abdominal pain     Rx / DC Orders   ED Discharge Orders     None        Note:  This document was prepared using Dragon voice recognition software and may include unintentional dictation errors.   Hinda Kehr, MD 03/05/21 [phone]

## 2021-03-05 NOTE — H&P (Signed)
History and Physical    [name] OAC:[id number] DOB: 04-13-1975 DOA: 03/04/2021  PCP: [facility]   Patient coming from: home  I have personally briefly reviewed patient's relevant medical records in [place]  Chief Complaint: vomiting, epigastric pain  HPI: [name] is a 46 y.o. female with medical history significant for Breast cancer s/p bilateral mastectomies reconstructive surgery s/p umbilicoplasty with skin graft from right lateral hip on 03/02/21, chronic musculoskeletal pain on chronic narcotics, mood disorder who presents to the ED with epigastric pain for several hours associated with nausea and vomiting.  Emesis is nonbloody and nonbilious.  She denies diarrhea.  Denies fever or chills, chest pain or shortness of breath. Denies increased pain at the surgical sites. States she periodically has these episodes of vomiting but this is the worst. Last EGD was over 15 years ago.  ED course: BP 130/116 on arrival with otherwise normal vitals Blood work WBC 11,000 with lactic acid 2.2-2.3, otherwise unremarkable  Imaging: CT abdomen and pelvis with findings consistent with postoperative changes within the region of the umbilicus Please refer to complete report  Patient treated with multiple rounds of droperidol, IV Benadryl, metoclopramide as well as IV Toradol and IV morphine but continued to have intractable abdominal pain and nausea.  Hospitalist consulted for admission.   Review of Systems: As per HPI otherwise all other systems on review of systems negative.   Assessment/Plan    Acute gastritis -IV Protonix, IV antiemetics - IV hydration - Pain control - NPO for now --Consider GI consult if not improving  Elevated lactic acid - Suspect secondary to intractable vomiting.  Sepsis not suspected at this time but will continue to monitor - IV hydration and monitor    Mood disorder ([place]) - Continue home meds pending med rec    H/O  bilateral mastectomy s/p reconstructive surgery - No acute disease.  Followed at [place] thickness skin graft //02/930 - No acute complications suspected    Chronic prescription opiate use - Continue meds as tolerated   DVT prophylaxis: Lovenox  Code Status: full code  Family Communication:  none  Disposition Plan: Back to previous home environment Consults called: none  Status: Observation    Physical Exam: Vitals:   03/05/21 0130 03/05/21 0200 03/05/21 0230 03/05/21 0356  BP:    (!) 141/57  Pulse: 62 74 68 75  Resp: 13 19 20    Temp:    98.1 F (36.7 C)  TempSrc:    Oral  SpO2: 100% 100% 100% 98%  Weight:      Height:       Constitutional: Alert, oriented x 3 . Not in any apparent distress HEENT:      Head: Normocephalic and atraumatic.         Eyes: PERLA, EOMI, Conjunctivae are normal. Sclera is non-icteric.       Mouth/Throat: Mucous membranes are moist.       Neck: Supple with no signs of meningismus. Cardiovascular: Regular rate and rhythm. No murmurs, gallops, or rubs. 2+ symmetrical distal pulses are present . No JVD. No  LE edema Respiratory: Respiratory effort normal .Lungs sounds clear bilaterally. No wheezes, crackles, or rhonchi.  Gastrointestinal: Soft, diffuse tenderness, non distended. Positive bowel sounds. Dressings dry Genitourinary: No CVA tenderness. Musculoskeletal: Nontender with normal range of motion in all extremities. No cyanosis, or erythema of extremities. Neurologic:  Face is symmetric. Moving all extremities. No gross focal neurologic deficits . Skin:  Skin is warm, dry.  No rash or ulcers Psychiatric: Mood and affect are appropriate     Past Medical History:  Diagnosis Date   Cancer ([place])    Breast   DJD (degenerative joint disease)    Thyroid disease     No past surgical history on file.   reports that she has quit smoking. She has never used smokeless tobacco. She reports that she does not drink alcohol  and does not use drugs.  Allergies  Allergen Reactions   Buspirone Other (See Comments)    Depressed mood and tearful     No family history on file.    Prior to Admission medications   Medication Sig Start Date End Date Taking? Authorizing Provider  docusate sodium (COLACE) 100 MG capsule Take 100 mg by mouth daily as needed for mild constipation.    [provider]  furosemide (LASIX) 40 MG tablet Take 40 mg by mouth 2 (two) times daily.    [provider]  gabapentin (NEURONTIN) 800 MG tablet Take 800 mg by mouth 3 (three) times daily.    [provider]  lamoTRIgine (LAMICTAL) 100 MG tablet Take 100 mg by mouth daily.    [provider]  methocarbamol (ROBAXIN) 500 MG tablet Take 500 mg by mouth 3 (three) times daily as needed for muscle pain.    [provider]  morphine (MS CONTIN) 15 MG 12 hr tablet Take 15 mg by mouth 2 (two) times daily. (Morning and afternoon)    [provider]  morphine (MS CONTIN) 30 MG 12 hr tablet Take 30 mg by mouth at bedtime.    [provider]  Multiple Vitamins-Minerals (MULTIVITAMIN WITH MINERALS) tablet Take 1 tablet by mouth daily.    [provider]  Omega-3 1000 MG CAPS Take by mouth.    [provider]  ondansetron (ZOFRAN) 4 MG tablet Take 4 mg by mouth every 8 (eight) hours as needed for nausea or vomiting.    [provider]  oxyCODONE (ROXICODONE) 15 MG immediate release tablet Take 15 mg by mouth every 4 (four) hours as needed for breakthrough pain.    [provider]  pantoprazole (PROTONIX) 40 MG tablet Take 40 mg by mouth 2 (two) times daily.    [provider]  potassium chloride SA (KLOR-CON) 20 MEQ tablet Take 20 mEq by mouth daily.    [provider]  propranolol ER (INDERAL LA) 60 MG 24 hr capsule Take 60 mg by mouth daily.    [provider]  QUEtiapine (SEROQUEL) 200 MG tablet Take 400 mg by mouth at bedtime.     [provider]  silver sulfADIAZINE (SILVADENE) 1 % cream Apply 1 application topically 2 (two) times daily.    [provider]      Labs on Admission: I have personally reviewed following labs and imaging studies  CBC: Recent Labs  Lab 03/04/21 2354  WBC 11.1*  NEUTROABS 7.3  HGB 15.2*  HCT 43.3  MCV 97.7  PLT 846   Basic Metabolic Panel: Recent Labs  Lab 03/04/21 2354  NA 138  K 4.0  CL 106  CO2 23  GLUCOSE 131*  BUN 16  CREATININE 0.74  CALCIUM 9.6   GFR: Estimated Creatinine Clearance: 112.6 mL/min (by C-G formula based on SCr of 0.74 mg/dL). Liver Function Tests: Recent Labs  Lab 03/04/21 2354  AST 27  ALT 12  ALKPHOS 85  BILITOT 1.2  PROT 8.4*  ALBUMIN 4.7  Recent Labs  Lab 03/05/21 0117  LIPASE 30   No results for input(s): AMMONIA in the last 168 hours. Coagulation Profile: Recent Labs  Lab 03/04/21 2354  INR 0.9   Cardiac Enzymes: No results for input(s): CKTOTAL, CKMB, CKMBINDEX, TROPONINI in the last 168 hours. BNP (last 3 results) No results for input(s): PROBNP in the last 8760 hours. HbA1C: No results for input(s): HGBA1C in the last 72 hours. CBG: No results for input(s): GLUCAP in the last 168 hours. Lipid Profile: No results for input(s): CHOL, HDL, LDLCALC, TRIG, CHOLHDL, LDLDIRECT in the last 72 hours. Thyroid Function Tests: No results for input(s): TSH, T4TOTAL, FREET4, T3FREE, THYROIDAB in the last 72 hours. Anemia Panel: No results for input(s): VITAMINB12, FOLATE, FERRITIN, TIBC, IRON, RETICCTPCT in the last 72 hours. Urine analysis:    Component Value Date/Time   COLORURINE YELLOW (A) 11/21/2020 1407   APPEARANCEUR HAZY (A) 11/21/2020 1407   APPEARANCEUR Hazy 01/15/2013 0927   LABSPEC 1.024 11/21/2020 1407   LABSPEC 1.010 01/15/2013 0927   PHURINE 5.0 11/21/2020 1407   GLUCOSEU NEGATIVE 11/21/2020 1407   GLUCOSEU Negative 01/15/2013 0927   HGBUR NEGATIVE 11/21/2020 1407   BILIRUBINUR  NEGATIVE 11/21/2020 1407   BILIRUBINUR Negative 01/15/2013 0927   KETONESUR NEGATIVE 11/21/2020 1407   PROTEINUR 30 (A) 11/21/2020 1407   NITRITE NEGATIVE 11/21/2020 1407   LEUKOCYTESUR NEGATIVE 11/21/2020 1407   LEUKOCYTESUR Trace 01/15/2013 0927    Radiological Exams on Admission: CT ABDOMEN PELVIS W CONTRAST  Result Date: 03/05/2021 CLINICAL DATA:  History of recent reconstruction surgery to the umbilical region, presenting with stabbing abdominal pain. EXAM: CT ABDOMEN AND PELVIS WITH CONTRAST TECHNIQUE: Multidetector CT imaging of the abdomen and pelvis was performed using the standard protocol following bolus administration of intravenous contrast. RADIATION DOSE REDUCTION: This exam was performed according to the departmental dose-optimization program which includes automated exposure control, adjustment of the mA and/or kV according to patient size and/or use of iterative reconstruction technique. CONTRAST:  179mL OMNIPAQUE IOHEXOL 300 MG/ML  SOLN COMPARISON:  November 21, 2020 and May 19, 2018 FINDINGS: Lower chest: A stable 5 mm noncalcified lung nodule is seen within the posterolateral aspect of the right lower lobe. Hepatobiliary: No focal liver abnormality is seen. Status post cholecystectomy. No biliary dilatation. Pancreas: Unremarkable. No pancreatic ductal dilatation or surrounding inflammatory changes. Spleen: Normal in size without focal abnormality. Adrenals/Urinary Tract: Adrenal glands are unremarkable. Kidneys are normal, without renal calculi, focal lesion, or hydronephrosis. Bladder is unremarkable. Stomach/Bowel: Stomach is within normal limits. Appendix appears normal. No evidence of bowel wall thickening, distention, or inflammatory changes. Vascular/Lymphatic: Aortic atherosclerosis. No enlarged abdominal or pelvic lymph nodes. Reproductive: Uterus and bilateral adnexa are unremarkable. Other: A 1.3 cm x 2.4 cm x 1.3 cm area of predominately air attenuation is seen within the  region of the umbilicus. A very mild amount of adjacent subcutaneous air and subcutaneous inflammatory fat stranding is seen. No abdominopelvic ascites. Musculoskeletal: No acute or significant osseous findings. IMPRESSION: 1. Findings likely consistent with postoperative changes within the region of the umbilicus. 2. Evidence of prior cholecystectomy. 3. Stable, benign 5 mm noncalcified right lower lobe lung nodule. 4. Aortic atherosclerosis. Aortic Atherosclerosis (ICD10-I70.0). Electronically Signed   By: Virgina Norfolk M.D.   On: 03/05/2021 01:59       Athena Masse MD [facility]   03/05/2021, 4:27 AM

## 2021-03-05 NOTE — Final Progress Note (Signed)
Same day rounding progress note.  Please see H & P for further details. I agree with the assessment and plans there.  Will advance diet as tolerated - soft now. Patient has had no further nausea/vomiting.  Restart pain meds per patient request.  Possible D/C in am  Time spent - 30 mins

## 2021-03-06 ENCOUNTER — Encounter: Payer: Self-pay | Admitting: Internal Medicine

## 2021-03-06 DIAGNOSIS — R1084 Generalized abdominal pain: Secondary | ICD-10-CM | POA: Diagnosis not present

## 2021-03-06 DIAGNOSIS — R1115 Cyclical vomiting syndrome unrelated to migraine: Secondary | ICD-10-CM | POA: Diagnosis not present

## 2021-03-06 DIAGNOSIS — K2900 Acute gastritis without bleeding: Secondary | ICD-10-CM | POA: Diagnosis not present

## 2021-03-06 DIAGNOSIS — R112 Nausea with vomiting, unspecified: Secondary | ICD-10-CM | POA: Diagnosis not present

## 2021-03-06 NOTE — Progress Notes (Signed)
Pt refusing telemetry monitoring.

## 2021-03-07 NOTE — Discharge Summary (Signed)
Physician Discharge Summary   Patient: [name] MRN: [id number] DOB: 01-29-1976  Admit date:     03/04/2021  Discharge date: 03/06/2021  Discharge Physician: Max Sane   PCP: [facility]   Recommendations at discharge:    F/up with outpt providers as requested  Discharge Diagnoses Active Problems:   Mood disorder [facility])   H/O bilateral mastectomy   S/P split thickness skin graft //17/2023   Acute gastritis   Chronic prescription opiate use   Intractable nausea and vomiting   Cyclic vomiting syndrome   Generalized abdominal pain  Resolved Problems:   * No resolved hospital problems. [facility]   46 y.o. female with medical history significant for Breast cancer s/p bilateral mastectomies reconstructive surgery s/p umbilicoplasty with skin graft from right lateral hip on 03/02/21, chronic musculoskeletal pain on chronic narcotics, mood disorder who presents to the ED with epigastric pain for several hours associated with nausea and vomiting.  Acute gastritis -Iresolved with symptomatic mgmt. Likely viral. Tolerating diet and pain much better control on home regimen  Elevated lactic acid - Suspect secondary to intractable vomiting.      Mood disorder ([place]) - Continue home meds      H/O bilateral mastectomy s/p reconstructive surgery - No acute disease.  Followed at [facility] thickness skin graft //46/5035 - No acute complications suspected     Chronic prescription opiate use - Continue meds as tolerated  Disposition: Home Diet recommendation: Cardiac diet  DISCHARGE MEDICATION: Allergies as of 03/06/2021       Reactions   Buspirone Other (See Comments)   Depressed mood and tearful  Other reaction(s): Other (See Comments) Depressed mood and tearful  Depressed mood and tearful         Medication List     STOP taking these medications    cephALEXin 500 MG capsule Commonly known as: KEFLEX   lamoTRIgine 100 MG  tablet Commonly known as: LAMICTAL   potassium chloride SA 20 MEQ tablet Commonly known as: KLOR-CON M   propranolol ER 60 MG 24 hr capsule Commonly known as: INDERAL LA   silver sulfADIAZINE 1 % cream Commonly known as: SILVADENE       TAKE these medications    docusate sodium 100 MG capsule Commonly known as: COLACE Take 100 mg by mouth daily as needed for mild constipation.   furosemide 40 MG tablet Commonly known as: LASIX Take 40 mg by mouth 2 (two) times daily.   gabapentin 800 MG tablet Commonly known as: NEURONTIN Take 800 mg by mouth 3 (three) times daily.   methocarbamol 500 MG tablet Commonly known as: ROBAXIN Take 500 mg by mouth 3 (three) times daily as needed for muscle pain.   morphine 15 MG 12 hr tablet Commonly known as: MS CONTIN Take 15 mg by mouth 2 (two) times daily. (Morning and afternoon)   morphine 30 MG 12 hr tablet Commonly known as: MS CONTIN Take 30 mg by mouth at bedtime.   multivitamin with minerals tablet Take 1 tablet by mouth daily.   Omega-3 1000 MG Caps Take by mouth.   ondansetron 4 MG tablet Commonly known as: ZOFRAN Take 4 mg by mouth every 8 (eight) hours as needed for nausea or vomiting.   oxyCODONE 15 MG immediate release tablet Commonly known as: ROXICODONE Take 15 mg by mouth every 4 (four) hours as needed for breakthrough pain.   pantoprazole 40 MG tablet Commonly known as: PROTONIX  Take 40 mg by mouth 2 (two) times daily.   QUEtiapine 200 MG tablet Commonly known as: SEROQUEL Take 400 mg by mouth at bedtime.   Rexulti 0.5 MG Tabs Generic drug: Brexpiprazole Take 1 tablet by mouth at bedtime.        Follow-up Information     [place]. Schedule an appointment as soon as possible for a visit in 1 week(s).   Why: [facility] Discharge F/UP Contact information: [place] [address] [phone]         Force, Shari Prows, DO. Schedule an appointment as soon as  possible for a visit on 03/11/2021.   Specialty: Student Why: as scheduled Contact information: [place] [address] [phone]                 Discharge Exam: Filed Weights   03/04/21 2317  Weight: 92.5 kg   Constitutional: Alert, oriented x 3 . Not in any apparent distress HEENT:      Head: Normocephalic and atraumatic.         Eyes: PERLA, EOMI, Conjunctivae are normal. Sclera is non-icteric.       Mouth/Throat: Mucous membranes are moist.       Neck: Supple with no signs of meningismus. Cardiovascular: Regular rate and rhythm. No murmurs, gallops, or rubs. 2+ symmetrical distal pulses are present . No JVD. No  LE edema Respiratory: Respiratory effort normal .Lungs sounds clear bilaterally. No wheezes, crackles, or rhonchi.  Gastrointestinal: Soft, diffuse tenderness, non distended. Positive bowel sounds. Dressings dry Genitourinary: No CVA tenderness. Musculoskeletal: Nontender with normal range of motion in all extremities. No cyanosis, or erythema of extremities. Neurologic:  Face is symmetric. Moving all extremities. No gross focal neurologic deficits . Skin: Skin is warm, dry.  No rash or ulcers Psychiatric: Mood and affect are appropriate     Condition at discharge: good  The results of significant diagnostics from this hospitalization (including imaging, microbiology, ancillary and laboratory) are listed below for reference.   Imaging Studies: CT ABDOMEN PELVIS W CONTRAST  Result Date: 03/05/2021 CLINICAL DATA:  History of recent reconstruction surgery to the umbilical region, presenting with stabbing abdominal pain. EXAM: CT ABDOMEN AND PELVIS WITH CONTRAST TECHNIQUE: Multidetector CT imaging of the abdomen and pelvis was performed using the standard protocol following bolus administration of intravenous contrast. RADIATION DOSE REDUCTION: This exam was performed according to the departmental dose-optimization program which includes  automated exposure control, adjustment of the mA and/or kV according to patient size and/or use of iterative reconstruction technique. CONTRAST:  182mL OMNIPAQUE IOHEXOL 300 MG/ML  SOLN COMPARISON:  November 21, 2020 and May 19, 2018 FINDINGS: Lower chest: A stable 5 mm noncalcified lung nodule is seen within the posterolateral aspect of the right lower lobe. Hepatobiliary: No focal liver abnormality is seen. Status post cholecystectomy. No biliary dilatation. Pancreas: Unremarkable. No pancreatic ductal dilatation or surrounding inflammatory changes. Spleen: Normal in size without focal abnormality. Adrenals/Urinary Tract: Adrenal glands are unremarkable. Kidneys are normal, without renal calculi, focal lesion, or hydronephrosis. Bladder is unremarkable. Stomach/Bowel: Stomach is within normal limits. Appendix appears normal. No evidence of bowel wall thickening, distention, or inflammatory changes. Vascular/Lymphatic: Aortic atherosclerosis. No enlarged abdominal or pelvic lymph nodes. Reproductive: Uterus and bilateral adnexa are unremarkable. Other: A 1.3 cm x 2.4 cm x 1.3 cm area of predominately air attenuation is seen within the region of the umbilicus. A very mild amount of adjacent subcutaneous air and subcutaneous inflammatory  fat stranding is seen. No abdominopelvic ascites. Musculoskeletal: No acute or significant osseous findings. IMPRESSION: 1. Findings likely consistent with postoperative changes within the region of the umbilicus. 2. Evidence of prior cholecystectomy. 3. Stable, benign 5 mm noncalcified right lower lobe lung nodule. 4. Aortic atherosclerosis. Aortic Atherosclerosis (ICD10-I70.0). Electronically Signed   By: Virgina Norfolk M.D.   On: 03/05/2021 01:59    Microbiology: Results for orders placed or performed during the hospital encounter of 03/04/21  Resp Panel by RT-PCR (Flu A&B, Covid) Nasopharyngeal Swab     Status: None   Collection Time: 03/05/21  5:13 AM   Specimen:  Nasopharyngeal Swab; Nasopharyngeal(NP) swabs in vial transport medium  Result Value Ref Range Status   SARS Coronavirus 2 by RT PCR NEGATIVE NEGATIVE Final    Comment: (NOTE) SARS-CoV-2 target nucleic acids are NOT DETECTED.  The SARS-CoV-2 RNA is generally detectable in upper respiratory specimens during the acute phase of infection. The lowest concentration of SARS-CoV-2 viral copies this assay can detect is 138 copies/mL. A negative result does not preclude SARS-Cov-2 infection and should not be used as the sole basis for treatment or other patient management decisions. A negative result may occur with  improper specimen collection/handling, submission of specimen other than nasopharyngeal swab, presence of viral mutation(s) within the areas targeted by this assay, and inadequate number of viral copies(<138 copies/mL). A negative result must be combined with clinical observations, patient history, and epidemiological information. The expected result is Negative.  Fact Sheet for Patients:  EntrepreneurPulse.com.au  Fact Sheet for Healthcare Providers:  IncredibleEmployment.be  This test is no t yet approved or cleared by the [place] FDA and  has been authorized for detection and/or diagnosis of SARS-CoV-2 by FDA under an Emergency Use Authorization (EUA). This EUA will remain  in effect (meaning this test can be used) for the duration of the COVID-19 declaration under Section 564(b)(1) of the Act, 21 U.S.C.section 360bbb-3(b)(1), unless the authorization is terminated  or revoked sooner.       Influenza A by PCR NEGATIVE NEGATIVE Final   Influenza B by PCR NEGATIVE NEGATIVE Final    Comment: (NOTE) The Xpert Xpress SARS-CoV-2/FLU/RSV plus assay is intended as an aid in the diagnosis of influenza from Nasopharyngeal swab specimens and should not be used as a sole basis for treatment. Nasal washings and aspirates are unacceptable for  Xpert Xpress SARS-CoV-2/FLU/RSV testing.  Fact Sheet for Patients: EntrepreneurPulse.com.au  Fact Sheet for Healthcare Providers: IncredibleEmployment.be  This test is not yet approved or cleared by the [place] FDA and has been authorized for detection and/or diagnosis of SARS-CoV-2 by FDA under an Emergency Use Authorization (EUA). This EUA will remain in effect (meaning this test can be used) for the duration of the COVID-19 declaration under Section 564(b)(1) of the Act, 21 U.S.C. section 360bbb-3(b)(1), unless the authorization is terminated or revoked.  Performed at [facility], [address] [place],  [address]     Labs: CBC: Recent Labs  Lab 03/04/21 2354 03/05/21 0513  WBC 11.1* 12.0*  NEUTROABS 7.3  --   HGB 15.2* 13.9  HCT 43.3 39.2  MCV 97.7 96.1  PLT 258 824   Basic Metabolic Panel: Recent Labs  Lab 03/04/21 2354 03/05/21 0513  NA 138  --   K 4.0  --   CL 106  --   CO2 23  --   GLUCOSE 131*  --   BUN 16  --   CREATININE 0.74 0.57  CALCIUM  9.6  --    Liver Function Tests: Recent Labs  Lab 03/04/21 2354  AST 27  ALT 12  ALKPHOS 85  BILITOT 1.2  PROT 8.4*  ALBUMIN 4.7   CBG: No results for input(s): GLUCAP in the last 168 hours.  Discharge time spent: greater than 30 minutes.  Signed: Max Sane, MD [facility] 03/07/2021

## 2021-04-16 IMAGING — CT CT ABDOMEN AND PELVIS WITH CONTRAST
2 of 5 series · 15 of 46 positions shown, 17 images · IV contrast (APPLIED)
Comparison: CT dated 05/19/2018.

CLINICAL DATA: Abdominal pain. The patient had a recent double
mastectomy and flap surgery [REDACTED].

EXAM:
CT ABDOMEN AND PELVIS WITH CONTRAST
TECHNIQUE: Multidetector CT imaging of the abdomen and pelvis was performed
using the standard protocol following bolus administration of
intravenous contrast.
CONTRAST:  100mL OMNIPAQUE IOHEXOL 300 MG/ML  SOLN

[Series 2: routine abd/pel with · axial · 0.71mm/px · z∈[-523,-93]mm · 12 of 98 slices shown, 14 images]
[im 6/98  soft-tissue]
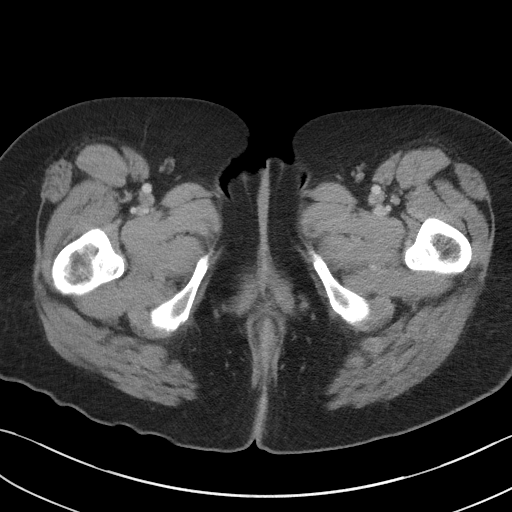
[im 6/98  bone]
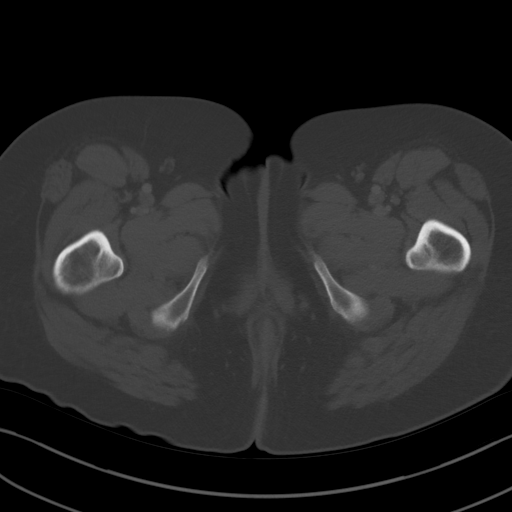
[im 18/98  soft-tissue]
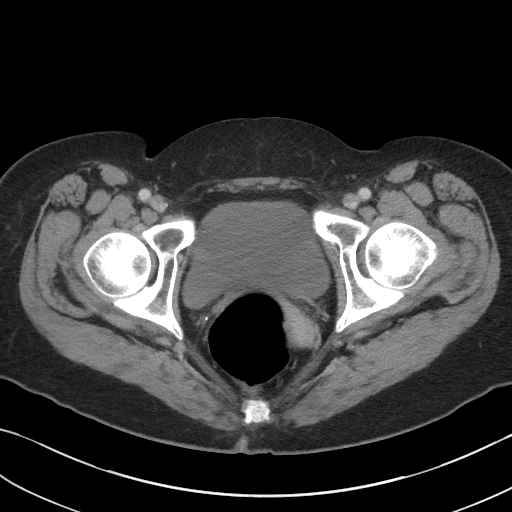
[im 23/98  soft-tissue]
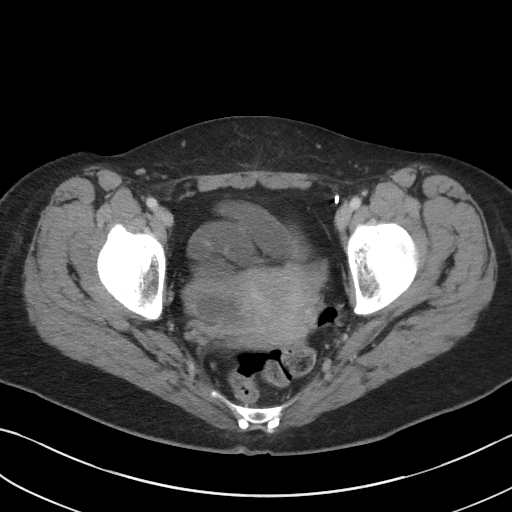
[im 29/98  soft-tissue]
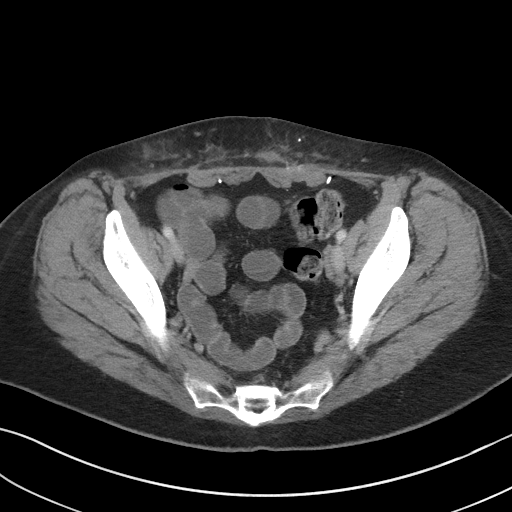
[im 40/98  soft-tissue]
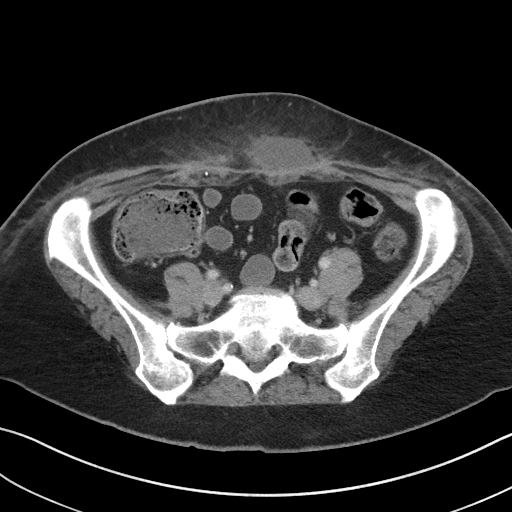
[im 46/98  soft-tissue]
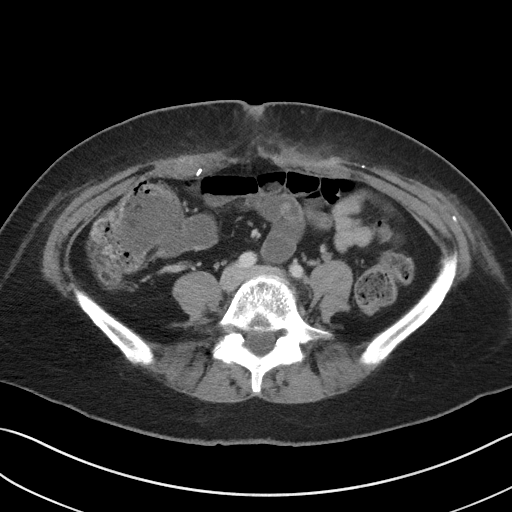
[im 52/98  soft-tissue]
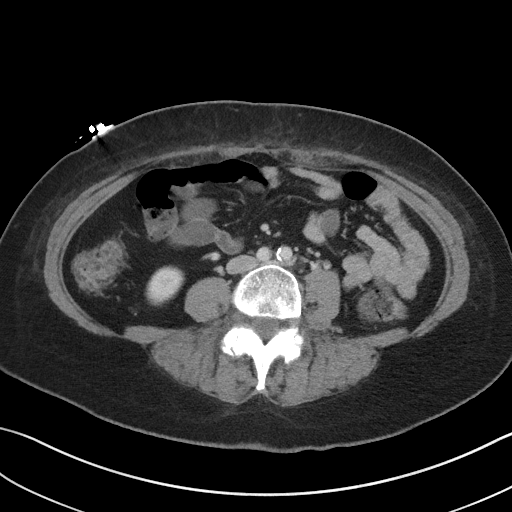
[im 63/98  soft-tissue]
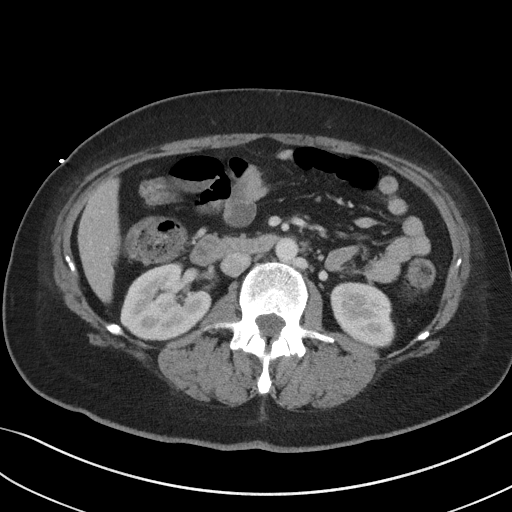
[im 69/98  soft-tissue]
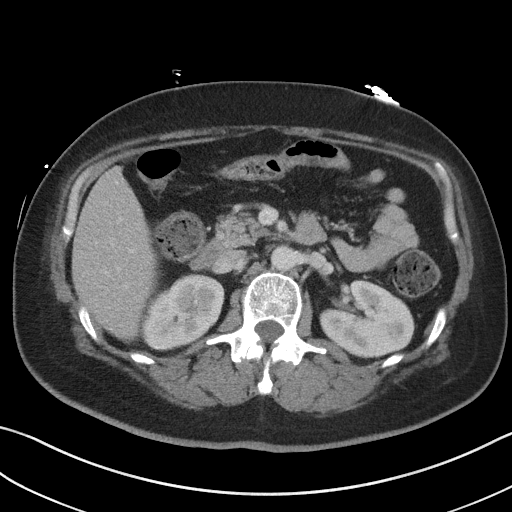
[im 69/98  bone]
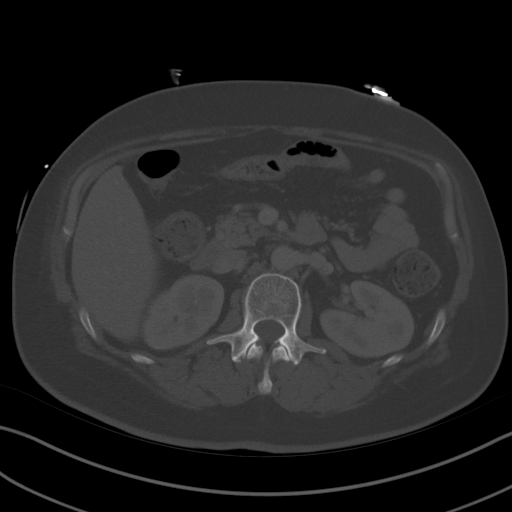
[im 75/98  soft-tissue]
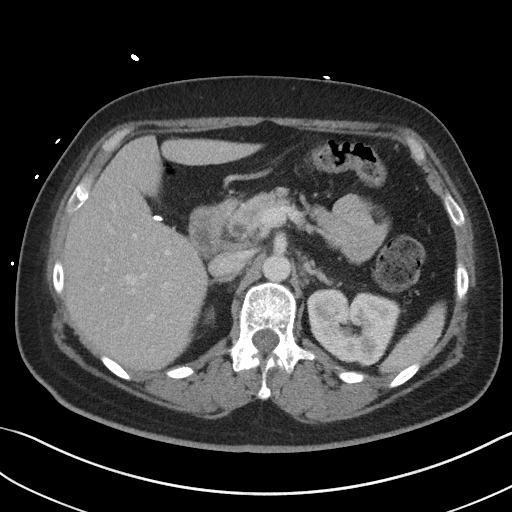
[im 86/98  soft-tissue]
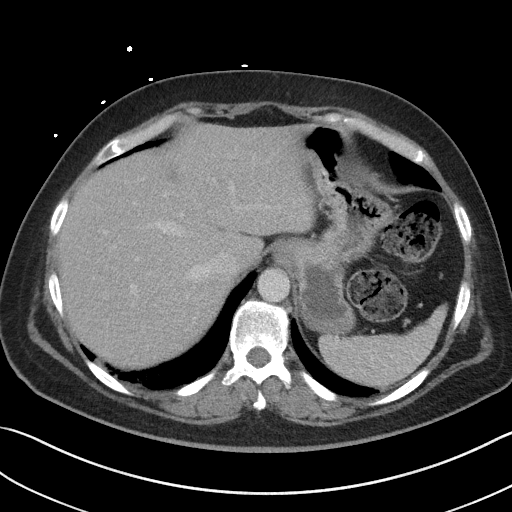
[im 92/98  soft-tissue]
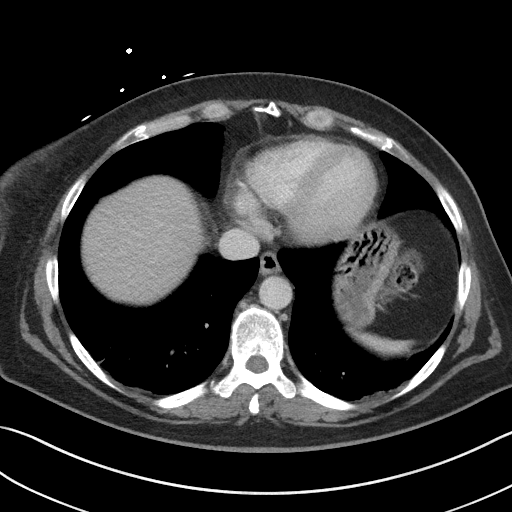

[Series 6: coronal st · coronal · 0.83mm/px · 3 of 88 slices shown]
[im 30/88  soft-tissue]
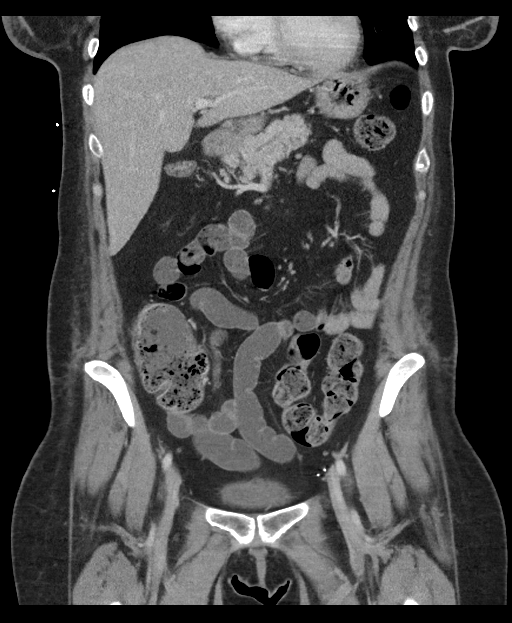
[im 39/88  soft-tissue]
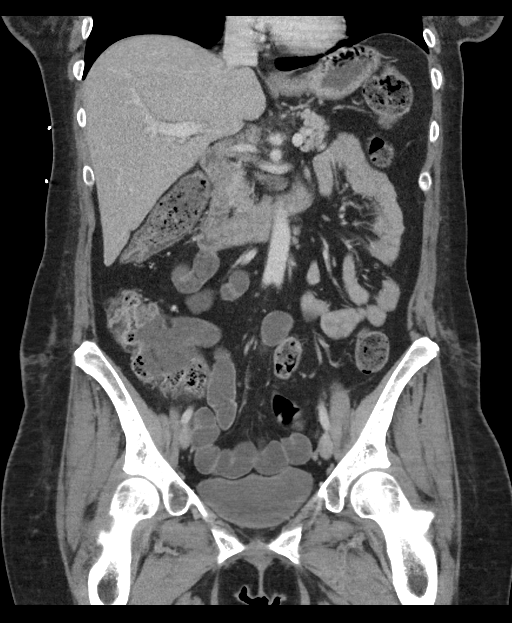
[im 49/88  soft-tissue]
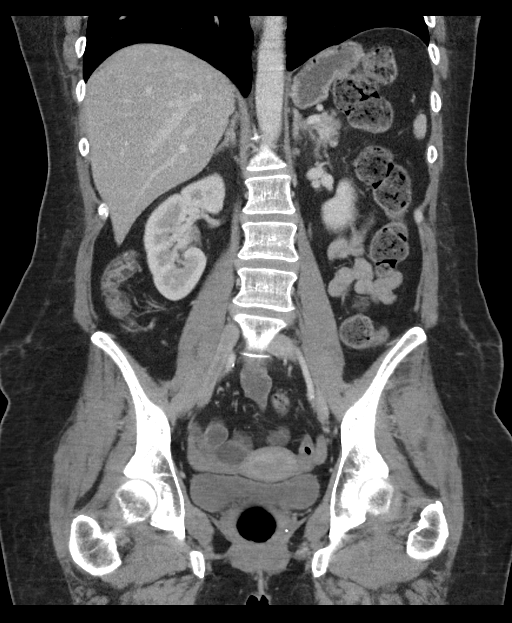

[15 of 46 positions shown; findings below may reference images not displayed]

FINDINGS: Lower chest: There is a 6 mm pulmonary nodule in the peripheral
right lower lobe (axial series 5, image 1). There is atelectasis at
the lung bases.The heart size is normal.

Hepatobiliary: The liver is normal. Status post
cholecystectomy.There is no biliary ductal dilation.

Pancreas: Normal contours without ductal dilatation. No
peripancreatic fluid collection.

Spleen: No splenic laceration or hematoma.

Adrenals/Urinary Tract:

--Adrenal glands: No adrenal hemorrhage.

--Right kidney/ureter: No hydronephrosis or perinephric hematoma.

--Left kidney/ureter: No hydronephrosis or perinephric hematoma.

--Urinary bladder: Unremarkable.

Stomach/Bowel:

--Stomach/Duodenum: No hiatal hernia or other gastric abnormality.
Normal duodenal course and caliber.

--Small bowel: There is mild dilatation of multiple fluid-filled
loops of bowel in the pelvis without evidence of a frank bowel
obstruction.

--Colon: No focal abnormality.

--Appendix: Not visualized. No right lower quadrant inflammation or
free fluid.

Vascular/Lymphatic: Atherosclerotic calcification is present within
the non-aneurysmal abdominal aorta, without hemodynamically
significant stenosis. There is a circumaortic left renal vein, a
normal variant.

--No retroperitoneal lymphadenopathy.

--No mesenteric lymphadenopathy.

--No pelvic or inguinal lymphadenopathy.

Reproductive: Unremarkable

Other: No ascites or free air. There are extensive inflammatory
changes in the low anterior abdominal wall. There is a 6.3 x 2.5 cm
air and fluid collection with in the anterior abdominal wall. There
are adjacent inflammatory changes. Multiple surgical clips are noted
in this region.

Musculoskeletal. No acute displaced fractures.
IMPRESSION: 1. There is a 6.3 x 2.5 cm air and fluid collection in the low
anterior abdominal wall concerning for an abscess.
2. There is a 6 mm pulmonary nodule in the right lower lobe as
detailed above. Non-contrast chest CT at 6-12 months is recommended.
If the nodule is stable at time of repeat CT, then future CT at
18-24 months (from today's scan) is considered optional for low-risk
patients, but is recommended for high-risk patients. This
recommendation follows the consensus statement: Guidelines for
Management of Incidental Pulmonary Nodules Detected on CT Images:

## 2021-07-19 ENCOUNTER — Emergency Department
Admission: EM | Admit: 2021-07-19 | Discharge: 2021-07-19 | Disposition: A | Discharge status: Home / Self Care | Payer: BC Managed Care – PPO | Attending: Emergency Medicine | Admitting: Emergency Medicine

## 2021-07-19 ENCOUNTER — Emergency Department: Payer: BC Managed Care – PPO

## 2021-07-19 DIAGNOSIS — K529 Noninfective gastroenteritis and colitis, unspecified: Secondary | ICD-10-CM | POA: Diagnosis not present

## 2021-07-19 DIAGNOSIS — Z853 Personal history of malignant neoplasm of breast: Secondary | ICD-10-CM | POA: Insufficient documentation

## 2021-07-19 DIAGNOSIS — R825 Elevated urine levels of drugs, medicaments and biological substances: Secondary | ICD-10-CM | POA: Insufficient documentation

## 2021-07-19 DIAGNOSIS — D72829 Elevated white blood cell count, unspecified: Secondary | ICD-10-CM | POA: Diagnosis not present

## 2021-07-19 DIAGNOSIS — R197 Diarrhea, unspecified: Secondary | ICD-10-CM | POA: Diagnosis present

## 2021-07-19 DIAGNOSIS — Z79891 Long term (current) use of opiate analgesic: Secondary | ICD-10-CM | POA: Diagnosis not present

## 2021-07-19 DIAGNOSIS — R1084 Generalized abdominal pain: Secondary | ICD-10-CM | POA: Diagnosis present

## 2021-07-19 LAB — URINALYSIS, COMPLETE (UACMP) WITH MICROSCOPIC
Bacteria, UA: NONE SEEN
Bilirubin Urine: NEGATIVE
Glucose, UA: NEGATIVE mg/dL
Ketones, ur: 5 mg/dL — AB
Leukocytes,Ua: NEGATIVE
Nitrite: NEGATIVE
Protein, ur: 30 mg/dL — AB
Specific Gravity, Urine: 1.026 (ref 1.005–1.030)
pH: 6 (ref 5.0–8.0)

## 2021-07-19 LAB — COMPREHENSIVE METABOLIC PANEL
ALT: 15 U/L (ref 0–44)
AST: 20 U/L (ref 15–41)
Albumin: 4.4 g/dL (ref 3.5–5.0)
Alkaline Phosphatase: 81 U/L (ref 38–126)
Anion gap: 7 (ref 5–15)
BUN: 13 mg/dL (ref 6–20)
CO2: 22 mmol/L (ref 22–32)
Calcium: 9.7 mg/dL (ref 8.9–10.3)
Chloride: 109 mmol/L (ref 98–111)
Creatinine, Ser: 0.46 mg/dL (ref 0.44–1.00)
GFR, Estimated: >60 mL/min (ref >60)
Glucose, Bld: 153 mg/dL — ABNORMAL HIGH (ref 70–99)
Potassium: 3.4 mmol/L — ABNORMAL LOW (ref 3.5–5.1)
Sodium: 138 mmol/L (ref 135–145)
Total Bilirubin: 0.4 mg/dL (ref 0.3–1.2)
Total Protein: 7.5 g/dL (ref 6.5–8.1)

## 2021-07-19 LAB — CBC WITH DIFFERENTIAL/PLATELET
Abs Immature Granulocytes: 0.04 10*3/uL (ref 0.00–0.07)
Basophils Absolute: 0.1 10*3/uL (ref 0.0–0.1)
Basophils Relative: 0 %
Eosinophils Absolute: 0 10*3/uL (ref 0.0–0.5)
Eosinophils Relative: 0 %
HCT: 42.9 % (ref 36.0–46.0)
Hemoglobin: 14.9 g/dL (ref 12.0–15.0)
Immature Granulocytes: 0 %
Lymphocytes Relative: 15 %
Lymphs Abs: 2.2 10*3/uL (ref 0.7–4.0)
MCH: 33.6 pg (ref 26.0–34.0)
MCHC: 34.7 g/dL (ref 30.0–36.0)
MCV: 96.6 fL (ref 80.0–100.0)
Monocytes Absolute: 0.9 10*3/uL (ref 0.1–1.0)
Monocytes Relative: 6 %
Neutro Abs: 11.5 10*3/uL — ABNORMAL HIGH (ref 1.7–7.7)
Neutrophils Relative %: 79 %
Platelets: 227 10*3/uL (ref 150–400)
RBC: 4.44 MIL/uL (ref 3.87–5.11)
RDW: 14.1 % (ref 11.5–15.5)
WBC: 14.7 10*3/uL — ABNORMAL HIGH (ref 4.0–10.5)
nRBC: 0 % (ref 0.0–0.2)

## 2021-07-19 LAB — URINE DRUG SCREEN, QUALITATIVE (ARMC ONLY)
Amphetamines, Ur Screen: NOT DETECTED
Barbiturates, Ur Screen: NOT DETECTED
Benzodiazepine, Ur Scrn: POSITIVE — AB
Cannabinoid 50 Ng, Ur ~~LOC~~: POSITIVE — AB
Cocaine Metabolite,Ur ~~LOC~~: NOT DETECTED
MDMA (Ecstasy)Ur Screen: NOT DETECTED
Methadone Scn, Ur: NOT DETECTED
Opiate, Ur Screen: POSITIVE — AB
Phencyclidine (PCP) Ur S: NOT DETECTED
Tricyclic, Ur Screen: POSITIVE — AB

## 2021-07-19 LAB — LIPASE, BLOOD: Lipase: 29 U/L (ref 11–51)

## 2021-07-19 LAB — HCG, QUANTITATIVE, PREGNANCY: hCG, Beta Chain, Quant, S: 4 m[IU]/mL (ref <5)

## 2021-07-19 LAB — LACTIC ACID, PLASMA: Lactic Acid, Venous: 1.7 mmol/L (ref 0.5–1.9)

## 2021-07-19 MED ORDER — DROPERIDOL 2.5 MG/ML IJ SOLN: 2.5000 mg | Freq: Once | INTRAMUSCULAR | Status: DC

## 2021-07-19 MED ORDER — PIPERACILLIN-TAZOBACTAM 3.375 G IVPB 30 MIN
3.3750 g | Freq: Once | INTRAVENOUS | Status: AC
  Administered 2021-07-19: 3.375 g via INTRAVENOUS
  Filled 2021-07-19: qty 50

## 2021-07-19 MED ORDER — METOCLOPRAMIDE HCL 5 MG/ML IJ SOLN
10.0000 mg | Freq: Once | INTRAMUSCULAR | Status: AC
  Administered 2021-07-19: 10 mg via INTRAVENOUS
  Filled 2021-07-19: qty 2

## 2021-07-19 MED ORDER — KETOROLAC TROMETHAMINE 15 MG/ML IJ SOLN
15.0000 mg | Freq: Once | INTRAMUSCULAR | Status: AC
  Administered 2021-07-19: 15 mg via INTRAVENOUS
  Filled 2021-07-19: qty 1

## 2021-07-19 MED ORDER — HALOPERIDOL LACTATE 5 MG/ML IJ SOLN: 2.0000 mg | Freq: Once | INTRAMUSCULAR | Status: DC

## 2021-07-19 MED ORDER — LACTATED RINGERS IV BOLUS
1000.0000 mL | Freq: Once | INTRAVENOUS | Status: AC
  Administered 2021-07-19: 1000 mL via INTRAVENOUS

## 2021-07-19 MED ORDER — METRONIDAZOLE 500 MG PO TABS: 500.0000 mg | ORAL_TABLET | Freq: Three times a day (TID) | ORAL | 0 refills | Status: AC

## 2021-07-19 MED ORDER — ONDANSETRON 4 MG PO TBDP: 4.0000 mg | ORAL_TABLET | Freq: Three times a day (TID) | ORAL | 0 refills | Status: DC | PRN

## 2021-07-19 MED ORDER — AMOXICILLIN-POT CLAVULANATE 875-125 MG PO TABS: 1.0000 | ORAL_TABLET | Freq: Two times a day (BID) | ORAL | 0 refills | Status: AC

## 2021-07-19 MED ORDER — OXYCODONE-ACETAMINOPHEN 5-325 MG PO TABS
1.0000 | ORAL_TABLET | Freq: Once | ORAL | Status: AC
  Administered 2021-07-19: 1 via ORAL
  Filled 2021-07-19: qty 1

## 2021-07-19 MED ORDER — DROPERIDOL 2.5 MG/ML IJ SOLN
5.0000 mg | Freq: Once | INTRAMUSCULAR | Status: AC
  Administered 2021-07-19: 5 mg via INTRAVENOUS
  Filled 2021-07-19: qty 2

## 2021-07-19 MED ORDER — MORPHINE SULFATE (PF) 4 MG/ML IV SOLN
4.0000 mg | Freq: Once | INTRAVENOUS | Status: AC
  Administered 2021-07-19: 4 mg via INTRAVENOUS
  Filled 2021-07-19: qty 1

## 2021-07-19 MED ORDER — ONDANSETRON HCL 4 MG/2ML IJ SOLN
4.0000 mg | Freq: Once | INTRAMUSCULAR | Status: AC
  Administered 2021-07-19: 4 mg via INTRAVENOUS
  Filled 2021-07-19: qty 2

## 2021-07-19 MED ORDER — IOHEXOL 300 MG/ML  SOLN
100.0000 mL | Freq: Once | INTRAMUSCULAR | Status: AC | PRN
  Administered 2021-07-19: 100 mL via INTRAVENOUS

## 2021-07-19 NOTE — ED Provider Notes (Signed)
[facility] Provider Note    Event Date/Time   First MD Initiated Contact with Patient 07/19/21 0302     (approximate)   History   Abdominal Pain   HPI  [name] is a 46 y.o. female with a history of remote breast cancer, cyclic vomiting, obesity, thyroid disease who presents for evaluation of abdominal pain.  Patient arrives screaming and moaning hysterically.  Describes generalized crampy abdominal pain, nausea, vomiting and diarrhea all day today.  No melena, hematemesis, hematochezia, coffee-ground emesis.  No fever or chills.  No chest pain or shortness of breath.     Past Medical History:  Diagnosis Date   Cancer ([place])    Breast   DJD (degenerative joint disease)    Thyroid disease     No past surgical history on file.   Physical Exam   Triage Vital Signs: ED Triage Vitals  Enc Vitals Group     BP 07/19/21 0258 106/64     Pulse Rate 07/19/21 0258 70     Resp 07/19/21 0256 (!) 22     Temp 07/19/21 0258 98.1 F (36.7 C)     Temp Source 07/19/21 0256 Oral     SpO2 07/19/21 0258 100 %     Weight 07/19/21 0256 201 lb (91.2 kg)     Height 07/19/21 0256 '5\' 11"'$  (1.803 m)     Head Circumference --      Peak Flow --      Pain Score --      Pain Loc --      Pain Edu? --      Excl. in [place]? --     Most recent vital signs: Vitals:   07/19/21 0500 07/19/21 0600  BP: (!) 164/95 (!) 161/95  Pulse: 68 (!) 55  Resp: 19 (!) 21  Temp:    SpO2: 98% 97%     Constitutional: Alert and oriented.  Screaming and thrashing on the stretcher HEENT:      Head: Normocephalic and atraumatic.         Eyes: Conjunctivae are normal. Sclera is non-icteric.       Mouth/Throat: Mucous membranes are moist.       Neck: Supple with no signs of meningismus. Cardiovascular: Regular rate and rhythm. No murmurs, gallops, or rubs. 2+ symmetrical distal pulses are present in all extremities.  Respiratory: Normal respiratory effort. Lungs are clear to  auscultation bilaterally.  Gastrointestinal: Soft, non tender, and non distended with positive bowel sounds. No rebound or guarding. Genitourinary: No CVA tenderness. Musculoskeletal:  No edema, cyanosis, or erythema of extremities. Neurologic: Normal speech and language. Face is symmetric. Moving all extremities. No gross focal neurologic deficits are appreciated. Skin: Skin is warm, dry and intact. No rash noted.   ED Results / Procedures / Treatments   Labs (all labs ordered are listed, but only abnormal results are displayed) Labs Reviewed  CBC WITH DIFFERENTIAL/PLATELET - Abnormal; Notable for the following components:      Result Value   WBC 14.7 (*)    Neutro Abs 11.5 (*)    All other components within normal limits  COMPREHENSIVE METABOLIC PANEL - Abnormal; Notable for the following components:   Potassium 3.4 (*)    Glucose, Bld 153 (*)    All other components within normal limits  URINALYSIS, COMPLETE (UACMP) WITH MICROSCOPIC - Abnormal; Notable for the following components:   Color, Urine YELLOW (*)    APPearance HAZY (*)    Hgb  urine dipstick SMALL (*)    Ketones, ur 5 (*)    Protein, ur 30 (*)    All other components within normal limits  URINE DRUG SCREEN, QUALITATIVE ([facility] ONLY) - Abnormal; Notable for the following components:   Tricyclic, Ur Screen POSITIVE (*)    Opiate, Ur Screen POSITIVE (*)    Cannabinoid 50 Ng, Ur [place] POSITIVE (*)    Benzodiazepine, Ur Scrn POSITIVE (*)    All other components within normal limits  LIPASE, BLOOD  LACTIC ACID, PLASMA  HCG, QUANTITATIVE, PREGNANCY     EKG  ED ECG REPORT I, Rudene Re, the attending physician, personally viewed and interpreted this ECG.  Sinus rhythm with a rate of 68, normal intervals, normal axis, no ST elevations or depressions.   RADIOLOGY I, Rudene Re, attending MD, have personally viewed and interpreted the images obtained during this visit as below:  CT concerning for  possible colitis   ___________________________________________________ Interpretation by Radiologist:  CT ABDOMEN PELVIS W CONTRAST  Result Date: 07/19/2021 CLINICAL DATA:  46 year old female with abdominal pain, nausea vomiting and diarrhea. History of ventral abdominal wall surgery. EXAM: CT ABDOMEN AND PELVIS WITH CONTRAST TECHNIQUE: Multidetector CT imaging of the abdomen and pelvis was performed using the standard protocol following bolus administration of intravenous contrast. RADIATION DOSE REDUCTION: This exam was performed according to the departmental dose-optimization program which includes automated exposure control, adjustment of the mA and/or kV according to patient size and/or use of iterative reconstruction technique. CONTRAST:  177m OMNIPAQUE IOHEXOL 300 MG/ML  SOLN COMPARISON:  CT Abdomen and Pelvis 03/05/2021 and earlier. FINDINGS: Lower chest: Stable, negative.  Minimal lung base atelectasis. Hepatobiliary: Chronically absent gallbladder.  Negative liver. Pancreas: Negative. Spleen: Negative. Adrenals/Urinary Tract: Normal adrenal glands. Kidneys are nonobstructed with symmetric and normal renal enhancement. No delayed images. No nephrolithiasis or pararenal inflammation. Occasional pelvic phleboliths. Both ureters appear decompressed and normal to the bladder. Decompressed bladder. Stomach/Bowel: Mild to moderate retained stool in the rectum. Decompressed sigmoid colon. Large bowel is diffusely decompressed similar in appearance to the January CT. There is no obvious large bowel mesenteric inflammation, but a mild generalized colitis is difficult to exclude, particularly in the descending colon (coronal image 44). There are occasional colonic diverticula (splenic flexure). Diminutive or absent appendix. Decompressed terminal ileum. No dilated small bowel. Stomach and duodenum are decompressed. No free air or free fluid. Chronic postoperative changes to the ventral abdominal wall with no  adverse features today. Vascular/Lymphatic: Aortoiliac calcified atherosclerosis. Major arterial structures in the abdomen and pelvis are patent. Portal venous system is patent. No lymphadenopathy identified. Reproductive: Stable, negative. Other: No pelvic free fluid. Musculoskeletal: Chronic disc degeneration at the lumbosacral junction. Chronic lower lumbar facet arthropathy. No acute osseous abnormality identified. IMPRESSION: 1. Diffusely decompressed large bowel, similar in appearance to the January CT but a mild generalized colitis is difficult to exclude. 2. No other acute or inflammatory process identified in the abdomen or pelvis. Chronic postoperative changes to the ventral abdominal wall with no adverse features now. Aortic Atherosclerosis (ICD10-I70.0). Electronically Signed   By: HGenevie AnnM.D.   On: 07/19/2021 05:16       PROCEDURES:  Critical Care performed: No  Procedures    IMPRESSION / MDM / ASSESSMENT AND PLAN / ED COURSE  I reviewed the triage vital signs and the nursing notes.  46y.o. female with a history of remote breast cancer, cyclic vomiting, obesity, thyroid disease who presents for evaluation of abdominal pain, nausea, vomiting, diarrhea  since earlier today.  Patient arrives screaming and thrashing on the stretcher, normal vital signs, palpation of the abdomen does not elicit any tenderness.  Ddx: Gastritis versus gastroenteritis versus gastroparesis versus colitis versus diverticulitis versus volvulus versus SBO versus ischemic colitis versus bowel perforation   Plan: CBC, CMP, lipase, urinalysis, hCG, lactic acid, EKG.  Will give IV droperidol, Toradol, and fluids.   MEDICATIONS GIVEN IN ED: Medications  lactated ringers bolus 1,000 mL (0 mLs Intravenous Stopped 07/19/21 0412)  ketorolac (TORADOL) 15 MG/ML injection 15 mg (15 mg Intravenous Given 07/19/21 0331)  droperidol (INAPSINE) 2.5 MG/ML injection 5 mg (5 mg Intravenous Given 07/19/21 0333)  iohexol  (OMNIPAQUE) 300 MG/ML solution 100 mL (100 mLs Intravenous Contrast Given 07/19/21 0442)  morphine (PF) 4 MG/ML injection 4 mg (4 mg Intravenous Given 07/19/21 0507)  metoCLOPramide (REGLAN) injection 10 mg (10 mg Intravenous Given 07/19/21 0506)  piperacillin-tazobactam (ZOSYN) IVPB 3.375 g (0 g Intravenous Stopped 07/19/21 0625)  oxyCODONE-acetaminophen (PERCOCET/ROXICET) 5-325 MG per tablet 1 tablet (1 tablet Oral Given 07/19/21 0557)  ondansetron (ZOFRAN) injection 4 mg (4 mg Intravenous Given 07/19/21 0557)     ED COURSE: Labs show leukocytosis with a left shift.  Normal lactic acid.  Normal LFTs and lipase, no significant electrolyte derangements, no AKI.  UA with no signs of UTI.  UDS is positive for tricyclic's, opiates, cannabinoids, and benzos.  Possibly gastroparesis in the setting of polysubstance abuse although CT says it is hard to exclude a mild generalized colitis therefore will give IV antibiotics.  Patient continues to complain of severe pain and nausea so we will give her Reglan and morphine.  _________________________ 5:53 AM on 07/19/2021 ----------------------------------------- Review of [place] controlled substance database shows that patient is on high doses of morphine at home.  Therefore will not provide a prescription for narcotics at this time.  She will be discharged home on Augmentin, Flagyl, and Zofran for colitis.  Recommended close follow-up with primary care doctor and discussed my standard return precautions   Consults: None   EMR reviewed including last visit with primary care doctor for an annual checkup    FINAL CLINICAL IMPRESSION(S) / ED DIAGNOSES   Final diagnoses:  Colitis     Rx / DC Orders   ED Discharge Orders          Ordered    metroNIDAZOLE (FLAGYL) 500 MG tablet  3 times daily        07/19/21 0546    amoxicillin-clavulanate (AUGMENTIN) 875-125 MG tablet  2 times daily        07/19/21 0546    ondansetron (ZOFRAN-ODT) 4 MG disintegrating tablet   Every 8 hours PRN        07/19/21 0546             Note:  This document was prepared using Dragon voice recognition software and may include unintentional dictation errors.   Please note:  Patient was evaluated in Emergency Department today for the symptoms described in the history of present illness. Patient was evaluated in the context of the global COVID-19 pandemic, which necessitated consideration that the patient might be at risk for infection with the SARS-CoV-2 virus that causes COVID-19. Institutional protocols and algorithms that pertain to the evaluation of patients at risk for COVID-19 are in a state of rapid change based on information released by regulatory bodies including the CDC and federal and state organizations. These policies and algorithms were followed during the patient's care in the ED.  Some  ED evaluations and interventions may be delayed as a result of limited staffing during the pandemic.       Alfred Levins, [place], MD 07/19/21 [phone]

## 2021-07-19 NOTE — ED Triage Notes (Signed)
Pt with generalized abd pain, nausea and vomiting, diarrhea that began today. Pt is near hysterical in triage, screaming and yelling at the top of her lungs, will not stay still for iv initiation, vital signs.

## 2021-07-19 NOTE — ED Notes (Signed)
Patient and spouse verbalized discharge understanding.

## 2021-07-19 NOTE — ED Notes (Signed)
Pt thrashing around in triage, screaming, cannot safely obtain iv, bloodwork ekg at this time. Pt is not participating safely in initation of care.

## 2021-07-26 ENCOUNTER — Encounter (HOSPITAL_BASED_OUTPATIENT_CLINIC_OR_DEPARTMENT_OTHER): Payer: Self-pay

## 2021-07-26 DIAGNOSIS — R0681 Apnea, not elsewhere classified: Secondary | ICD-10-CM

## 2021-07-26 DIAGNOSIS — R0683 Snoring: Secondary | ICD-10-CM

## 2021-08-15 ENCOUNTER — Ambulatory Visit (HOSPITAL_BASED_OUTPATIENT_CLINIC_OR_DEPARTMENT_OTHER): Payer: BC Managed Care – PPO | Attending: Family Medicine | Admitting: Internal Medicine

## 2021-08-15 VITALS — Ht 69.0 in | Wt 203.0 lb

## 2021-08-15 DIAGNOSIS — R0681 Apnea, not elsewhere classified: Secondary | ICD-10-CM | POA: Diagnosis not present

## 2021-08-15 DIAGNOSIS — R0683 Snoring: Secondary | ICD-10-CM | POA: Insufficient documentation

## 2021-08-15 DIAGNOSIS — G4733 Obstructive sleep apnea (adult) (pediatric): Secondary | ICD-10-CM

## 2021-08-22 DIAGNOSIS — R0683 Snoring: Secondary | ICD-10-CM

## 2021-08-22 NOTE — Procedures (Signed)
Patient Name: [name], [name] Date: 08/15/2021 Gender: Female D.O.B: August 29, 1975 Age (years): 65 Referring Provider: Not Available Height (inches): 69 Interpreting Physician: Baird Lyons MD, ABSM Weight (lbs): 203 RPSGT: Baxter Flattery BMI: 30 MRN: [id number] Neck Size: 16.00  CLINICAL INFORMATION Sleep Study Type: NPSG Indication for sleep study: Snoring, Witnesses Apnea / Gasping During Sleep Epworth Sleepiness Score: 7  SLEEP STUDY TECHNIQUE As per the AASM Manual for the Scoring of Sleep and Associated Events v2.3 (April 2016) with a hypopnea requiring 4% desaturations.  The channels recorded and monitored were frontal, central and occipital EEG, electrooculogram (EOG), submentalis EMG (chin), nasal and oral airflow, thoracic and abdominal wall motion, anterior tibialis EMG, snore microphone, electrocardiogram, and pulse oximetry.  MEDICATIONS Medications self-administered by patient taken the night of the study : none reported  SLEEP ARCHITECTURE The study was initiated at 9:54:42 PM and ended at 4:50:49 AM.  Sleep onset time was 8.0 minutes and the sleep efficiency was 96.4%%. The total sleep time was 401.1 minutes.  Stage REM latency was 60.5 minutes.  The patient spent 0.6%% of the night in stage N1 sleep, 60.5%% in stage N2 sleep, 10.8%% in stage N3 and 28.1% in REM.  Alpha intrusion was absent.  Supine sleep was 100.00%.  RESPIRATORY PARAMETERS The overall apnea/hypopnea index (AHI) was 9.7 per hour. There were 39 total apneas, including 29 obstructive, 10 central and 0 mixed apneas. There were 26 hypopneas and 4 RERAs.  The AHI during Stage REM sleep was 17.1 per hour.  AHI while supine was 9.7 per hour.  The mean oxygen saturation was 90.7%. The minimum SpO2 during sleep was 87.0%.  moderate snoring was noted during this study.  CARDIAC DATA The 2 lead EKG demonstrated sinus rhythm. The mean heart rate was 73.9 beats per minute. Other EKG  findings include: None.  LEG MOVEMENT DATA The total PLMS were 0 with a resulting PLMS index of 0.0. Associated arousal with leg movement index was 0.0 .  IMPRESSIONS - Mild obstructive sleep apnea occurred during this study (AHI = 9.7/h). - No significant central sleep apnea occurred during this study (CAI = 1.5/h). - Mild oxygen desaturation was noted during this study (Min O2 = 87.0%). Mean 90.7%. Time with O2 saturation 88% or less was 7.9 minutes. - The patient snored with moderate snoring volume. - No cardiac abnormalities were noted during this study. - Clinically significant periodic limb movements did not occur during sleep. No significant associated arousals.  DIAGNOSIS - Obstructive Sleep Apnea (G47.33) - Nocturnal Hypoxemia (G47.36)  RECOMMENDATIONS - Management for mild OSA can be conservative, based on symptoms and co-morbidity. In this case, with mild oxygen desaturation, consider if CPAP, a fitted oral appliance, or ENT evaluation would be helpful. - Positional therapy avoiding supine position during sleep. - Be careful with alcohol, sedatives and other CNS depressants that may worsen sleep apnea and disrupt normal sleep architecture. - Sleep hygiene should be reviewed to assess factors that may improve sleep quality. - Weight management and regular exercise should be initiated or continued if appropriate.  [Electronically signed] 08/22/2021 12:11 PM  Baird Lyons MD, [place], American Board of Sleep Medicine NPI: [id number]                         [place] [place] [facility]  ELECTRONICALLY SIGNED ON:  08/22/2021, 12:06 PM [place] PH: [phone]   FX: [phone] ACCREDITED BY  THE AMERICAN ACADEMY OF SLEEP MEDICINE

## 2021-09-08 ENCOUNTER — Other Ambulatory Visit: Payer: Self-pay

## 2021-09-08 DIAGNOSIS — S99911A Unspecified injury of right ankle, initial encounter: Secondary | ICD-10-CM | POA: Diagnosis present

## 2021-09-08 DIAGNOSIS — S82421A Displaced transverse fracture of shaft of right fibula, initial encounter for closed fracture: Secondary | ICD-10-CM | POA: Diagnosis not present

## 2021-09-08 DIAGNOSIS — W108XXA Fall (on) (from) other stairs and steps, initial encounter: Secondary | ICD-10-CM | POA: Insufficient documentation

## 2021-09-08 NOTE — ED Triage Notes (Signed)
Pt presents to ER c/o right ankle pain after falling while going up steps around 1 hr ago.  Pt has swelling noted to right lateral ankle and pain all around her ankle area.  Pt has pedal pulses noted.  Pt otherwise A&O x4 at this time in NAD in triage.

## 2021-09-09 ENCOUNTER — Emergency Department: Payer: BC Managed Care – PPO

## 2021-09-09 ENCOUNTER — Emergency Department
Admission: EM | Admit: 2021-09-09 | Discharge: 2021-09-09 | Disposition: A | Discharge status: Home / Self Care | Payer: BC Managed Care – PPO | Attending: Emergency Medicine | Admitting: Emergency Medicine

## 2021-09-09 DIAGNOSIS — S82831A Other fracture of upper and lower end of right fibula, initial encounter for closed fracture: Secondary | ICD-10-CM

## 2021-09-09 DIAGNOSIS — S82421A Displaced transverse fracture of shaft of right fibula, initial encounter for closed fracture: Secondary | ICD-10-CM | POA: Diagnosis not present

## 2021-09-09 DIAGNOSIS — M25571 Pain in right ankle and joints of right foot: Secondary | ICD-10-CM

## 2021-09-09 MED ORDER — OXYCODONE HCL 5 MG PO TABS
5.0000 mg | ORAL_TABLET | Freq: Once | ORAL | Status: AC
  Administered 2021-09-09: 5 mg via ORAL
  Filled 2021-09-09: qty 1

## 2021-09-09 MED ORDER — ACETAMINOPHEN 500 MG PO TABS
1000.0000 mg | ORAL_TABLET | Freq: Once | ORAL | Status: AC
  Administered 2021-09-09: 1000 mg via ORAL
  Filled 2021-09-09: qty 2

## 2021-09-09 MED ORDER — KETOROLAC TROMETHAMINE 30 MG/ML IJ SOLN
30.0000 mg | Freq: Once | INTRAMUSCULAR | Status: AC
  Administered 2021-09-09: 30 mg via INTRAMUSCULAR
  Filled 2021-09-09: qty 1

## 2021-09-09 NOTE — ED Notes (Signed)
E-signature pad unavailable - Pt verbalized understanding of D/C information - no additional concerns at this time.  

## 2021-09-09 NOTE — Discharge Instructions (Signed)
Please take Tylenol and ibuprofen/Advil for your pain.  It is safe to take them together, or to alternate them every few hours.  Take up to '1000mg'$  of Tylenol at a time, up to 4 times per day.  Do not take more than 4000 mg of Tylenol in 24 hours.  For ibuprofen, take 400-600 mg, 3 - 4 times per day.  Keep the boot on essentially all the time, even while sleeping. You can take it off to carefully shower and change clothes.   Rest the foot/ankle. Elevate and use ice to help with swelling and pain.   Follow up with your PCP or the podiatrist after about 2 weeks for a recheck. Stay in the boot until you're told to come out of it.

## 2021-09-09 NOTE — ED Provider Notes (Signed)
[facility] Provider Note    Event Date/Time   First MD Initiated Contact with Patient 09/09/21 [phone]     (approximate)   History   Ankle Pain   HPI  [name] is a 46 y.o. female who presents to the ED for evaluation of Ankle Pain   Patient presents to the ED with her husband for evaluation of right ankle pain after she tripped going up the steps.  Denies any syncope or head trauma, but is not sure exactly how she hurt her ankle in the process of tripping.  Denies any pain beyond the lateral aspect of the right foot and ankle.   Physical Exam   Triage Vital Signs: ED Triage Vitals  Enc Vitals Group     BP 09/08/21 2344 114/76     Pulse Rate 09/08/21 2344 99     Resp 09/08/21 2344 19     Temp 09/08/21 2344 98.7 F (37.1 C)     Temp Source 09/08/21 2344 Oral     SpO2 09/08/21 2344 99 %     Weight 09/08/21 2345 201 lb (91.2 kg)     Height 09/08/21 2345 '5\' 11"'$  (1.803 m)     Head Circumference --      Peak Flow --      Pain Score 09/08/21 2344 8     Pain Loc --      Pain Edu? --      Excl. in [place]? --     Most recent vital signs: Vitals:   09/08/21 2344 09/09/21 0433  BP: 114/76 118/70  Pulse: 99 89  Resp: 19 18  Temp: 98.7 F (37.1 C)   SpO2: 99% 98%    General: Awake, no distress.  CV:  Good peripheral perfusion.  Resp:  Normal effort.  Abd:  No distention.  MSK:  Close soft tissue swelling and deformity noted to the right lateral ankle and foot.  Tenderness to palpation is present. Neuro:  No focal deficits appreciated. Other:     ED Results / Procedures / Treatments   Labs (all labs ordered are listed, but only abnormal results are displayed) Labs Reviewed - No data to display  EKG   RADIOLOGY Plain film of the right ankle as interpreted by me with transverse distal fibular fracture.  Official radiology report(s): DG Ankle Complete Right  Result Date: 09/09/2021 CLINICAL DATA:  Fall, right ankle pain EXAM: RIGHT  ANKLE - COMPLETE 3+ VIEW COMPARISON:  None Available. FINDINGS: Transverse distal radial fracture. The ankle mortise is intact. Tiny osseous densities along the lateral midfoot on the frontal radiograph may reflect an avulsion injury. Mild lateral soft tissue swelling. IMPRESSION: Transverse distal fibular fracture. Tiny osseous densities along the lateral midfoot on the frontal radiograph may reflect an avulsion injury. Lateral soft tissue swelling. Electronically Signed   By: Julian Hy M.D.   On: 09/09/2021 00:27    PROCEDURES and INTERVENTIONS:  Procedures  Medications  acetaminophen (TYLENOL) tablet 1,000 mg (1,000 mg Oral Given 09/09/21 0414)  ketorolac (TORADOL) 30 MG/ML injection 30 mg (30 mg Intramuscular Given 09/09/21 0414)  oxyCODONE (Oxy IR/ROXICODONE) immediate release tablet 5 mg (5 mg Oral Given 09/09/21 0414)     IMPRESSION / MDM / ASSESSMENT AND PLAN / ED COURSE  I reviewed the triage vital signs and the nursing notes.  Differential diagnosis includes, but is not limited to, fracture, dislocation, ankle sprain  46 year old female presents to the ED with right ankle and foot  pain after a mechanical injury, with evidence of a distal fibular fracture and fifth metatarsal fracture, suitable for outpatient management.  Provided boot, crutches and information to follow-up with podiatry.  Discussed nonnarcotic multimodal analgesia at home.  RICE therapy.  X-ray, as above.      FINAL CLINICAL IMPRESSION(S) / ED DIAGNOSES   Final diagnoses:  Acute right ankle pain  Other closed fracture of distal end of right fibula, initial encounter     Rx / DC Orders   ED Discharge Orders     None        Note:  This document was prepared using Dragon voice recognition software and may include unintentional dictation errors.   Vladimir Crofts, MD 09/09/21 [phone]

## 2021-12-12 ENCOUNTER — Other Ambulatory Visit: Payer: Self-pay

## 2021-12-12 ENCOUNTER — Emergency Department: Payer: BC Managed Care – PPO

## 2021-12-12 ENCOUNTER — Encounter: Payer: Self-pay | Admitting: Emergency Medicine

## 2021-12-12 ENCOUNTER — Emergency Department
Admission: EM | Admit: 2021-12-12 | Discharge: 2021-12-12 | Disposition: A | Discharge status: Home / Self Care | Payer: BC Managed Care – PPO | Attending: Emergency Medicine | Admitting: Emergency Medicine

## 2021-12-12 DIAGNOSIS — Z853 Personal history of malignant neoplasm of breast: Secondary | ICD-10-CM | POA: Insufficient documentation

## 2021-12-12 DIAGNOSIS — R112 Nausea with vomiting, unspecified: Secondary | ICD-10-CM

## 2021-12-12 DIAGNOSIS — R1013 Epigastric pain: Secondary | ICD-10-CM

## 2021-12-12 DIAGNOSIS — R1010 Upper abdominal pain, unspecified: Secondary | ICD-10-CM | POA: Diagnosis present

## 2021-12-12 LAB — CBC
HCT: 45.8 % (ref 36.0–46.0)
Hemoglobin: 16 g/dL — ABNORMAL HIGH (ref 12.0–15.0)
MCH: 34.1 pg — ABNORMAL HIGH (ref 26.0–34.0)
MCHC: 34.9 g/dL (ref 30.0–36.0)
MCV: 97.7 fL (ref 80.0–100.0)
Platelets: 274 10*3/uL (ref 150–400)
RBC: 4.69 MIL/uL (ref 3.87–5.11)
RDW: 13.8 % (ref 11.5–15.5)
WBC: 10.4 10*3/uL (ref 4.0–10.5)
nRBC: 0 % (ref 0.0–0.2)

## 2021-12-12 LAB — COMPREHENSIVE METABOLIC PANEL
ALT: 18 U/L (ref 0–44)
AST: 27 U/L (ref 15–41)
Albumin: 5 g/dL (ref 3.5–5.0)
Alkaline Phosphatase: 86 U/L (ref 38–126)
Anion gap: 11 (ref 5–15)
BUN: 13 mg/dL (ref 6–20)
CO2: 20 mmol/L — ABNORMAL LOW (ref 22–32)
Calcium: 10.2 mg/dL (ref 8.9–10.3)
Chloride: 107 mmol/L (ref 98–111)
Creatinine, Ser: 0.74 mg/dL (ref 0.44–1.00)
GFR, Estimated: >60 mL/min (ref >60)
Glucose, Bld: 145 mg/dL — ABNORMAL HIGH (ref 70–99)
Potassium: 4 mmol/L (ref 3.5–5.1)
Sodium: 138 mmol/L (ref 135–145)
Total Bilirubin: 1.3 mg/dL — ABNORMAL HIGH (ref 0.3–1.2)
Total Protein: 8.7 g/dL — ABNORMAL HIGH (ref 6.5–8.1)

## 2021-12-12 LAB — LIPASE, BLOOD: Lipase: 34 U/L (ref 11–51)

## 2021-12-12 MED ORDER — MORPHINE SULFATE ER 15 MG PO TBCR
15.0000 mg | EXTENDED_RELEASE_TABLET | Freq: Two times a day (BID) | ORAL | Status: DC
  Administered 2021-12-12: 15 mg via ORAL
  Filled 2021-12-12: qty 1

## 2021-12-12 MED ORDER — METHOCARBAMOL 500 MG PO TABS
500.0000 mg | ORAL_TABLET | Freq: Once | ORAL | Status: AC
  Administered 2021-12-12: 500 mg via ORAL
  Filled 2021-12-12: qty 1

## 2021-12-12 MED ORDER — OXYCODONE HCL 5 MG PO TABS
15.0000 mg | ORAL_TABLET | Freq: Once | ORAL | Status: AC
  Administered 2021-12-12: 15 mg via ORAL
  Filled 2021-12-12: qty 3

## 2021-12-12 MED ORDER — PROMETHAZINE HCL 12.5 MG PO TABS: 12.5000 mg | ORAL_TABLET | Freq: Four times a day (QID) | ORAL | 0 refills | Status: AC | PRN

## 2021-12-12 MED ORDER — DROPERIDOL 2.5 MG/ML IJ SOLN
2.5000 mg | Freq: Once | INTRAMUSCULAR | Status: AC
  Administered 2021-12-12: 2.5 mg via INTRAVENOUS
  Filled 2021-12-12: qty 2

## 2021-12-12 MED ORDER — FAMOTIDINE IN NACL 20-0.9 MG/50ML-% IV SOLN
20.0000 mg | Freq: Once | INTRAVENOUS | Status: AC
  Administered 2021-12-12: 20 mg via INTRAVENOUS
  Filled 2021-12-12: qty 50

## 2021-12-12 MED ORDER — SODIUM CHLORIDE 0.9 % IV BOLUS
1000.0000 mL | Freq: Once | INTRAVENOUS | Status: AC
  Administered 2021-12-12: 1000 mL via INTRAVENOUS

## 2021-12-12 MED ORDER — MORPHINE SULFATE (PF) 4 MG/ML IV SOLN
4.0000 mg | INTRAVENOUS | Status: DC | PRN
  Administered 2021-12-12: 4 mg via INTRAVENOUS
  Filled 2021-12-12: qty 1

## 2021-12-12 MED ORDER — KETOROLAC TROMETHAMINE 15 MG/ML IJ SOLN
15.0000 mg | Freq: Once | INTRAMUSCULAR | Status: AC
  Administered 2021-12-12: 15 mg via INTRAVENOUS
  Filled 2021-12-12: qty 1

## 2021-12-12 MED ORDER — GABAPENTIN 300 MG PO CAPS
800.0000 mg | ORAL_CAPSULE | Freq: Once | ORAL | Status: AC
  Administered 2021-12-12: 800 mg via ORAL
  Filled 2021-12-12: qty 2

## 2021-12-12 MED ORDER — METOCLOPRAMIDE HCL 5 MG/ML IJ SOLN
10.0000 mg | Freq: Once | INTRAMUSCULAR | Status: AC
Start: 2021-12-12 — End: 2021-12-12
  Administered 2021-12-12: 10 mg via INTRAVENOUS
  Filled 2021-12-12: qty 2

## 2021-12-12 MED ORDER — IOHEXOL 300 MG/ML  SOLN
100.0000 mL | Freq: Once | INTRAMUSCULAR | Status: AC | PRN
  Administered 2021-12-12: 100 mL via INTRAVENOUS

## 2021-12-12 NOTE — ED Provider Notes (Signed)
[facility] Provider Note    None    (approximate)   History   Abdominal Pain   HPI  [name] is a 46 y.o. female with past medical history significant for remote history of breast cancer, with cyclical vomiting syndrome, gastritis, presents to the emergency department with upper abdominal pain.  Endorses upper abdominal pain with nausea and vomiting.  States that it feels the same as prior episodes that she has had in the past.  Started last night and worsened this morning.  States that she is unable to keep anything down.  Denies any fever or chills.  States that she had a endoscopy and colonoscopy within the past couple of months and showed no acute findings.  Does state that she takes Protonix 40 mg daily.  Denies any dysuria, urinary urgency or frequency.  Denies any alcohol use.  Endorses intermittent marijuana use and last smoked marijuana 1 week ago.      Physical Exam   Triage Vital Signs: ED Triage Vitals [12/12/21 1158]  Enc Vitals Group     BP 130/68     Pulse Rate 65     Resp (!) 22     Temp      Temp src      SpO2 100 %     Weight      Height      Head Circumference      Peak Flow      Pain Score 10     Pain Loc      Pain Edu?      Excl. in [place]?     Most recent vital signs: Vitals:   12/12/21 1158 12/12/21 1524  BP: 130/68 (!) 146/88  Pulse: 65 69  Resp: (!) 22 18  Temp:  97.8 F (36.6 C)  SpO2: 100% 100%    Physical Exam Constitutional:      General: She is in acute distress.     Comments: Standing in the hallway, screaming and yelling, refusing to go into a room  HENT:     Head: Atraumatic.  Eyes:     Conjunctiva/sclera: Conjunctivae normal.  Cardiovascular:     Rate and Rhythm: Regular rhythm.  Pulmonary:     Effort: No respiratory distress.  Abdominal:     General: There is no distension.     Tenderness: There is abdominal tenderness in the epigastric area. There is no right CVA tenderness or left CVA  tenderness. Negative signs include Murphy's sign and McBurney's sign.  Musculoskeletal:        General: Normal range of motion.     Cervical back: Normal range of motion.  Skin:    General: Skin is warm.     Capillary Refill: Capillary refill takes less than 2 seconds.  Neurological:     Mental Status: Mental status is at baseline.          IMPRESSION / MDM / ASSESSMENT AND PLAN / ED COURSE  I reviewed the triage vital signs and the nursing notes.  Differential diagnosis including cyclical vomiting syndrome, gastritis/PUD, pancreatitis, small bowel obstruction, duodenitis, symptomatic cholelithiasis, acute cholecystitis, appendicitis.  Patient was yelling in the hallway, stated that she had severe upper abdominal pain.  Ultimately able to convince the patient to get an IV started and was given a dose of IV droperidol.  Given IV fluids.  On chart review from outside records patient had an endoscopy and colonoscopy done in June showed no acute  findings.  It does state on her medical record that she is supposed to take Protonix 40 mg twice daily which she is only been taking daily.  On reevaluation patient continues to have nausea and upper abdominal pain.  Given another fluid bolus, IV Reglan and IV ketorolac.  Given ongoing abdominal pain will obtain a CT scan of her abdomen and pelvis for further evaluation.  Have a low suspicion for atypical ACS.  Urine currently pending. EKG   EKG showed normal QTc 462.  No significant ST elevation or depression.  No signs of acute ischemia or dysrhythmia.  No tachycardic or bradycardic dysrhythmias on cardiac telemetry.  RADIOLOGY       ED Results / Procedures / Treatments   Labs (all labs ordered are listed, but only abnormal results are displayed) Labs interpreted as -    Labs Reviewed  COMPREHENSIVE METABOLIC PANEL - Abnormal; Notable for the following components:      Result Value   CO2 20 (*)    Glucose, Bld 145 (*)     Total Protein 8.7 (*)    Total Bilirubin 1.3 (*)    All other components within normal limits  CBC - Abnormal; Notable for the following components:   Hemoglobin 16.0 (*)    MCH 34.1 (*)    All other components within normal limits  LIPASE, BLOOD  URINALYSIS, ROUTINE W REFLEX MICROSCOPIC  URINE DRUG SCREEN, QUALITATIVE ([facility] ONLY)  POC URINE PREG, ED    Lab work appears dehydrated with a CO2 of 20.  Glucose normal at 145.  Normal LFTs.  Lipase normal.  No significant leukocytosis.   Care transferred to Dr. Quentin Cornwall, plan for reevaluation after CT scan, if able to tolerate p.o. and findings are reassuring plans to discharge home and patient was instructed to start taking her Protonix twice daily and follow-up with her gastroenterologist.    PROCEDURES:  Critical Care performed: No  Procedures  Patient's presentation is most consistent with acute presentation with potential threat to life or bodily function.   MEDICATIONS ORDERED IN ED: Medications  famotidine (PEPCID) IVPB 20 mg premix (20 mg Intravenous New Bag/Given 12/12/21 1518)  ketorolac (TORADOL) 15 MG/ML injection 15 mg (has no administration in time range)  droperidol (INAPSINE) 2.5 MG/ML injection 2.5 mg (2.5 mg Intravenous Given 12/12/21 1347)  sodium chloride 0.9 % bolus 1,000 mL (0 mLs Intravenous Stopped 12/12/21 1519)  sodium chloride 0.9 % bolus 1,000 mL (1,000 mLs Intravenous New Bag/Given 12/12/21 1519)  metoCLOPramide (REGLAN) injection 10 mg (10 mg Intravenous Given 12/12/21 1517)    FINAL CLINICAL IMPRESSION(S) / ED DIAGNOSES   Final diagnoses:  Epigastric pain  Nausea and vomiting, unspecified vomiting type     Rx / DC Orders   ED Discharge Orders     None        Note:  This document was prepared using Dragon voice recognition software and may include unintentional dictation errors.   Nathaniel Man, MD 12/12/21 1538

## 2021-12-12 NOTE — ED Notes (Signed)
Pt yelling in hallway, " Please it hurts, someone help me please." RN went to pt, pt sts that she is not able to take any of her PO meds for pain or nausea. RN notified charge nurse of pt complaint. Pt sts " I am having abd pain and cramping." Pt starts to scream at this time " I am in pain and I need something now."

## 2021-12-12 NOTE — ED Triage Notes (Addendum)
Diffuse abdominal pain and n/v/d since this morning.  Pt screaming in pain at time of triage.  Pt is vomiting.  Pt states she has finished treatment for breast CA but has been undergoing reconstruction.  Is on several pain medications but states she cannot keep them down today. Spouse states they have been seen here multiple times for same with no "real answer"

## 2021-12-12 NOTE — ED Notes (Addendum)
Pt yelling for pain medication.  Declined zofran or po meds in triage. Declined labs until pain medication can be ordered.  Pt states "they never find anything wrong so what's the use"  "please help me"  Pt placed in family waiting for comfort, this RN apologized for the wait, and informed the patient  she will be placed in a room as soon as one is available.

## 2021-12-12 NOTE — ED Provider Notes (Signed)
Patient received in signout from Dr. Namon Cirri pending follow-up CT imaging and reassessment.  CT imaging without acute findings.  Patient reassessed describing severe abdominal pain not keeping down any of her home meds.  She was given her home meds as well as dose of IV morphine as I do suspect a component of withdrawal was contributing to her presentation.  Her symptoms have resolved patient feels comfortable going home at this point.  She has not provided urine but denies any dysuria or symptoms of UTI.  Does appear stable and appropriate for outpatient follow-up.   Merlyn Lot, MD 12/12/21 2101

## 2022-02-10 ENCOUNTER — Encounter (HOSPITAL_COMMUNITY): Payer: Self-pay | Admitting: Emergency Medicine

## 2022-02-10 ENCOUNTER — Other Ambulatory Visit: Payer: Self-pay

## 2022-02-10 ENCOUNTER — Ambulatory Visit (HOSPITAL_COMMUNITY)
Admission: EM | Admit: 2022-02-10 | Discharge: 2022-02-10 | Disposition: A | Discharge status: Home / Self Care | Payer: Medicare PPO | Attending: Internal Medicine | Admitting: Internal Medicine

## 2022-02-10 DIAGNOSIS — U071 COVID-19: Secondary | ICD-10-CM | POA: Diagnosis not present

## 2022-02-10 MED ORDER — BENZONATATE 200 MG PO CAPS: 200.0000 mg | ORAL_CAPSULE | Freq: Three times a day (TID) | ORAL | 0 refills | Status: AC | PRN

## 2022-02-10 MED ORDER — MOLNUPIRAVIR EUA 200MG CAPSULE: 4.0000 | ORAL_CAPSULE | Freq: Two times a day (BID) | ORAL | 0 refills | Status: AC

## 2022-02-10 NOTE — ED Triage Notes (Signed)
Patient is requesting paxlovid.    Symptoms started this evening.  Reports runny nose and is told she sounds hoarse.    Not taking any medicines  Reports several co-workers tested positive for covid last week.    Patient has taken 2 home covid test today and faint positive

## 2022-02-10 NOTE — ED Provider Notes (Signed)
[place]    CSN: [id number] Arrival date & time: 02/10/22  1953      History   Chief Complaint Chief Complaint  Patient presents with   covid positive    HPI [name] is a 46 y.o. female comes to the urgent care with runny nose and hoarseness of voice which started today.  Patient denies any fever or chills.  She reports several coworkers tested positive for COVID-19 infection this past week.  Patient tested positive using the home COVID test.  She has some cough but no sputum production.  She comes to the urgent care for evaluation and antiviral therapy.  No nausea or vomiting or diarrhea.Marland Kitchen   HPI  Past Medical History:  Diagnosis Date   Cancer ([place])    Breast   DJD (degenerative joint disease)    Thyroid disease     Patient Active Problem List   Diagnosis Date Noted   H/O bilateral mastectomy 03/05/2021   S/P split thickness skin graft //17/2023 03/05/2021   Acute gastritis 03/05/2021   Chronic prescription opiate use 03/05/2021   Intractable nausea and vomiting    Cyclic vomiting syndrome    Generalized abdominal pain    Patellofemoral pain syndrome of both knees 07/09/2019   Other specified postprocedural states 06/07/2019   Abdominal wall abscess 08/22/2018   Neuropathy 10/03/2017   Radicular leg pain 10/03/2017   Malignant neoplasm of upper-inner quadrant of right breast in female, estrogen receptor negative ([place]) 08/14/2017   Bipolar disorder, in partial remission, most recent episode depressed ([place]) 10/04/2016   Anxiety 07/04/2016   TOBACCO ABUSE 05/21/2007   GERD 05/21/2007   Mood disorder ([place]) 10/30/2006   GOITER NOS 04/13/2006   OBESITY, NOS 04/13/2006   DEPRESSION, MAJOR, RECURRENT 04/13/2006   Depression, major, recurrent ([place]) 04/13/2006   Class 1 obesity due to excess calories with serious comorbidity and body mass index (BMI) of 33.0 to 33.9 in adult 04/13/2006    History reviewed. No pertinent surgical history.  OB  History   No obstetric history on file.      Home Medications    Prior to Admission medications   Medication Sig Start Date End Date Taking? Authorizing Provider  benzonatate (TESSALON) 200 MG capsule Take 1 capsule (200 mg total) by mouth 3 (three) times daily as needed for cough. 02/10/22  Yes Pariss Hommes, Myrene Galas, MD  molnupiravir EUA (LAGEVRIO) 200 mg CAPS capsule Take 4 capsules (800 mg total) by mouth 2 (two) times daily for 5 days. 02/10/22 02/15/22 Yes Zailey Audia, Myrene Galas, MD  docusate sodium (COLACE) 100 MG capsule Take 100 mg by mouth daily as needed for mild constipation.    [provider]  furosemide (LASIX) 40 MG tablet Take 40 mg by mouth 2 (two) times daily.    [provider]  gabapentin (NEURONTIN) 800 MG tablet Take 800 mg by mouth 3 (three) times daily.    [provider]  methocarbamol (ROBAXIN) 500 MG tablet Take 500 mg by mouth 3 (three) times daily as needed for muscle pain.    [provider]  morphine (MS CONTIN) 15 MG 12 hr tablet Take 15 mg by mouth 2 (two) times daily. (Morning and afternoon)    [provider]  morphine (MS CONTIN) 30 MG 12 hr tablet Take 30 mg by mouth at bedtime.    [provider]  Multiple Vitamins-Minerals (MULTIVITAMIN WITH MINERALS) tablet Take 1 tablet by mouth daily.    [provider]  Omega-3 1000 MG CAPS Take by mouth.    [provider]  ondansetron (ZOFRAN) 4 MG tablet Take 4 mg by mouth every 8 (eight) hours as needed for nausea or vomiting.    [provider]  ondansetron (ZOFRAN-ODT) 4 MG disintegrating tablet Take 1 tablet (4 mg total) by mouth every 8 (eight) hours as needed for nausea or vomiting. 07/19/21   Rudene Re, MD  oxyCODONE (ROXICODONE) 15 MG immediate release tablet Take 15 mg by mouth every 4 (four) hours as needed for breakthrough pain.    [provider]  pantoprazole (PROTONIX) 40 MG tablet Take 40 mg by mouth 2 (two) times  daily.    [provider]  promethazine (PHENERGAN) 12.5 MG tablet Take 1 tablet (12.5 mg total) by mouth every 6 (six) hours as needed for nausea or vomiting. 12/12/21   Merlyn Lot, MD  QUEtiapine (SEROQUEL) 200 MG tablet Take 400 mg by mouth at bedtime.    [provider]  REXULTI 0.5 MG TABS Take 1 tablet by mouth at bedtime. 02/23/21   [provider]    Family History History reviewed. No pertinent family history.  Social History Social History   Tobacco Use   Smoking status: Some Days    Types: Cigarettes   Smokeless tobacco: Never  Vaping Use   Vaping Use: Never used  Substance Use Topics   Alcohol use: No   Drug use: No     Allergies   Buspirone   Review of Systems Review of Systems As per HPI  Physical Exam Triage Vital Signs ED Triage Vitals  Enc Vitals Group     BP 02/10/22 2107 112/78     Pulse Rate 02/10/22 2107 85     Resp 02/10/22 2107 20     Temp 02/10/22 2107 98 F (36.7 C)     Temp Source 02/10/22 2107 Oral     SpO2 02/10/22 2107 96 %     Weight --      Height --      Head Circumference --      Peak Flow --      Pain Score 02/10/22 2104 0     Pain Loc --      Pain Edu? --      Excl. in [place]? --    No data found.  Updated Vital Signs BP 112/78 (BP Location: Left Arm)   Pulse 85   Temp 98 F (36.7 C) (Oral)   Resp 20   SpO2 96%   Visual Acuity Right Eye Distance:   Left Eye Distance:   Bilateral Distance:    Right Eye Near:   Left Eye Near:    Bilateral Near:     Physical Exam Vitals and nursing note reviewed.  Constitutional:      General: She is not in acute distress.    Appearance: She is not ill-appearing.  HENT:     Right Ear: Tympanic membrane normal.     Left Ear: Tympanic membrane normal.     Mouth/Throat:     Mouth: Mucous membranes are moist.     Pharynx: No oropharyngeal exudate or posterior oropharyngeal erythema.  Eyes:     Extraocular Movements: Extraocular movements intact.      Conjunctiva/sclera: Conjunctivae normal.  Cardiovascular:     Rate and Rhythm: Normal rate and regular rhythm.     Pulses: Normal pulses.     Heart sounds: Normal heart sounds.  Pulmonary:     Effort: Pulmonary  effort is normal.     Breath sounds: Normal breath sounds.  Musculoskeletal:        General: Normal range of motion.  Neurological:     Mental Status: She is alert.      UC Treatments / Results  Labs (all labs ordered are listed, but only abnormal results are displayed) Labs Reviewed - No data to display  EKG   Radiology No results found.  Procedures Procedures (including critical care time)  Medications Ordered in UC Medications - No data to display  Initial Impression / Assessment and Plan / UC Course  I have reviewed the triage vital signs and the nursing notes.  Pertinent labs & imaging results that were available during my care of the patient were reviewed by me and considered in my medical decision making (see chart for details).    1.  COVID-19 infection: Molnupiravir Tylenol/Motrin as needed for pain and/or fever Tessalon Perles as needed for cough No imaging indicated at this time Return precautions given   Final Clinical Impressions(s) / UC Diagnoses   Final diagnoses:  COVID-19 virus infection     Discharge Instructions      Please increase oral fluid intake Take medications as prescribed Tylenol/Ibuprofen as needed for pain and/or fever If symptoms worsen please return to urgent care to be reevaluated      ED Prescriptions     Medication Sig Dispense Auth. Provider   molnupiravir EUA (LAGEVRIO) 200 mg CAPS capsule Take 4 capsules (800 mg total) by mouth 2 (two) times daily for 5 days. 40 capsule Sharra Cayabyab, Myrene Galas, MD   benzonatate (TESSALON) 200 MG capsule Take 1 capsule (200 mg total) by mouth 3 (three) times daily as needed for cough. 30 capsule Markeesha Char, Myrene Galas, MD      PDMP not reviewed this encounter.   Chase Picket, MD 02/13/22 1535

## 2022-02-10 NOTE — Discharge Instructions (Addendum)
Please increase oral fluid intake Take medications as prescribed Tylenol/Ibuprofen as needed for pain and/or fever If symptoms worsen please return to urgent care to be reevaluated

## 2022-04-05 ENCOUNTER — Other Ambulatory Visit (HOSPITAL_BASED_OUTPATIENT_CLINIC_OR_DEPARTMENT_OTHER): Payer: Self-pay

## 2022-04-05 DIAGNOSIS — G4733 Obstructive sleep apnea (adult) (pediatric): Secondary | ICD-10-CM

## 2023-05-05 ENCOUNTER — Emergency Department (HOSPITAL_COMMUNITY): Payer: Self-pay

## 2023-05-05 ENCOUNTER — Emergency Department (HOSPITAL_COMMUNITY)
Admission: EM | Admit: 2023-05-05 | Discharge: 2023-05-05 | Disposition: A | Discharge status: Home / Self Care | Payer: Self-pay | Attending: Emergency Medicine | Admitting: Emergency Medicine

## 2023-05-05 ENCOUNTER — Other Ambulatory Visit: Payer: Self-pay

## 2023-05-05 DIAGNOSIS — F12188 Cannabis abuse with other cannabis-induced disorder: Secondary | ICD-10-CM | POA: Insufficient documentation

## 2023-05-05 DIAGNOSIS — R112 Nausea with vomiting, unspecified: Secondary | ICD-10-CM | POA: Diagnosis present

## 2023-05-05 DIAGNOSIS — R1116 Cannabis hyperemesis syndrome: Secondary | ICD-10-CM

## 2023-05-05 DIAGNOSIS — R1084 Generalized abdominal pain: Secondary | ICD-10-CM

## 2023-05-05 DIAGNOSIS — D72829 Elevated white blood cell count, unspecified: Secondary | ICD-10-CM | POA: Diagnosis not present

## 2023-05-05 LAB — DIFFERENTIAL
Abs Immature Granulocytes: 0.19 10*3/uL — ABNORMAL HIGH (ref 0.00–0.07)
Basophils Absolute: 0 10*3/uL (ref 0.0–0.1)
Basophils Relative: 0 %
Eosinophils Absolute: 0 10*3/uL (ref 0.0–0.5)
Eosinophils Relative: 0 %
Immature Granulocytes: 1 %
Lymphocytes Relative: 10 %
Lymphs Abs: 2.5 10*3/uL (ref 0.7–4.0)
Monocytes Absolute: 0.9 10*3/uL (ref 0.1–1.0)
Monocytes Relative: 3 %
Neutro Abs: 21.5 10*3/uL — ABNORMAL HIGH (ref 1.7–7.7)
Neutrophils Relative %: 86 %

## 2023-05-05 LAB — URINALYSIS, MICROSCOPIC (REFLEX)

## 2023-05-05 LAB — COMPREHENSIVE METABOLIC PANEL
ALT: 24 U/L (ref 0–44)
AST: 28 U/L (ref 15–41)
Albumin: 4 g/dL (ref 3.5–5.0)
Alkaline Phosphatase: 76 U/L (ref 38–126)
Anion gap: 13 (ref 5–15)
BUN: 12 mg/dL (ref 6–20)
CO2: 19 mmol/L — ABNORMAL LOW (ref 22–32)
Calcium: 10 mg/dL (ref 8.9–10.3)
Chloride: 105 mmol/L (ref 98–111)
Creatinine, Ser: 0.69 mg/dL (ref 0.44–1.00)
GFR, Estimated: >60 mL/min (ref >60)
Glucose, Bld: 158 mg/dL — ABNORMAL HIGH (ref 70–99)
Potassium: 4.4 mmol/L (ref 3.5–5.1)
Sodium: 137 mmol/L (ref 135–145)
Total Bilirubin: 0.5 mg/dL (ref 0.0–1.2)
Total Protein: 7.7 g/dL (ref 6.5–8.1)

## 2023-05-05 LAB — CBC
HCT: 42.6 % (ref 36.0–46.0)
HCT: 43.2 % (ref 36.0–46.0)
Hemoglobin: 14.9 g/dL (ref 12.0–15.0)
Hemoglobin: 14.9 g/dL (ref 12.0–15.0)
MCH: 33.9 pg (ref 26.0–34.0)
MCH: 33.7 pg (ref 26.0–34.0)
MCHC: 35 g/dL (ref 30.0–36.0)
MCHC: 34.5 g/dL (ref 30.0–36.0)
MCV: 96.8 fL (ref 80.0–100.0)
MCV: 97.7 fL (ref 80.0–100.0)
Platelets: 299 10*3/uL (ref 150–400)
Platelets: 295 10*3/uL (ref 150–400)
RBC: 4.4 MIL/uL (ref 3.87–5.11)
RBC: 4.42 MIL/uL (ref 3.87–5.11)
RDW: 14.9 % (ref 11.5–15.5)
RDW: 14.9 % (ref 11.5–15.5)
WBC: 25.6 10*3/uL — ABNORMAL HIGH (ref 4.0–10.5)
WBC: 25 10*3/uL — ABNORMAL HIGH (ref 4.0–10.5)
nRBC: 0 % (ref 0.0–0.2)
nRBC: 0 % (ref 0.0–0.2)

## 2023-05-05 LAB — RAPID URINE DRUG SCREEN, HOSP PERFORMED
Amphetamines: NOT DETECTED
Barbiturates: NOT DETECTED
Benzodiazepines: POSITIVE — AB
Cocaine: NOT DETECTED
Opiates: POSITIVE — AB
Tetrahydrocannabinol: POSITIVE — AB

## 2023-05-05 LAB — I-STAT CG4 LACTIC ACID, ED
Lactic Acid, Venous: 3 mmol/L (ref 0.5–1.9)
Lactic Acid, Venous: 2.8 mmol/L (ref 0.5–1.9)

## 2023-05-05 LAB — URINALYSIS, ROUTINE W REFLEX MICROSCOPIC
Bilirubin Urine: NEGATIVE
Glucose, UA: NEGATIVE mg/dL
Ketones, ur: NEGATIVE mg/dL
Leukocytes,Ua: NEGATIVE
Nitrite: NEGATIVE
Protein, ur: 30 mg/dL — AB
Specific Gravity, Urine: 1.025 (ref 1.005–1.030)
pH: 6 (ref 5.0–8.0)

## 2023-05-05 LAB — HCG, SERUM, QUALITATIVE: Preg, Serum: NEGATIVE

## 2023-05-05 LAB — PROTIME-INR
INR: 1 (ref 0.8–1.2)
Prothrombin Time: 13.7 s (ref 11.4–15.2)

## 2023-05-05 LAB — APTT: aPTT: 28 s (ref 24–36)

## 2023-05-05 LAB — LIPASE, BLOOD: Lipase: 26 U/L (ref 11–51)

## 2023-05-05 MED ORDER — LACTATED RINGERS IV SOLN: INTRAVENOUS | Status: DC

## 2023-05-05 MED ORDER — CAPSAICIN 0.025 % EX CREA
TOPICAL_CREAM | Freq: Once | CUTANEOUS | Status: AC
  Filled 2023-05-05: qty 60

## 2023-05-05 MED ORDER — ONDANSETRON 4 MG PO TBDP
4.0000 mg | ORAL_TABLET | Freq: Once | ORAL | Status: AC
  Administered 2023-05-05: 4 mg via ORAL
  Filled 2023-05-05: qty 1

## 2023-05-05 MED ORDER — VANCOMYCIN HCL IN DEXTROSE 1-5 GM/200ML-% IV SOLN: 1000.0000 mg | Freq: Once | INTRAVENOUS | Status: DC

## 2023-05-05 MED ORDER — SODIUM CHLORIDE 0.9 % IV SOLN
2.0000 g | Freq: Once | INTRAVENOUS | Status: AC
  Administered 2023-05-05: 2 g via INTRAVENOUS
  Filled 2023-05-05: qty 12.5

## 2023-05-05 MED ORDER — IOHEXOL 350 MG/ML SOLN
75.0000 mL | Freq: Once | INTRAVENOUS | Status: AC | PRN
  Administered 2023-05-05: 75 mL via INTRAVENOUS

## 2023-05-05 MED ORDER — MORPHINE SULFATE (PF) 4 MG/ML IV SOLN
4.0000 mg | Freq: Once | INTRAVENOUS | Status: AC
  Administered 2023-05-05: 4 mg via INTRAVENOUS
  Filled 2023-05-05: qty 1

## 2023-05-05 MED ORDER — METRONIDAZOLE 500 MG/100ML IV SOLN
500.0000 mg | Freq: Once | INTRAVENOUS | Status: AC
  Administered 2023-05-05: 500 mg via INTRAVENOUS
  Filled 2023-05-05: qty 100

## 2023-05-05 MED ORDER — ONDANSETRON 4 MG PO TBDP: 4.0000 mg | ORAL_TABLET | Freq: Three times a day (TID) | ORAL | 0 refills | Status: AC | PRN

## 2023-05-05 MED ORDER — FENTANYL CITRATE PF 50 MCG/ML IJ SOSY
50.0000 ug | PREFILLED_SYRINGE | Freq: Once | INTRAMUSCULAR | Status: AC
  Administered 2023-05-05: 50 ug via INTRAMUSCULAR
  Filled 2023-05-05: qty 1

## 2023-05-05 MED ORDER — ONDANSETRON HCL 4 MG/2ML IJ SOLN
4.0000 mg | Freq: Once | INTRAMUSCULAR | Status: AC
  Administered 2023-05-05: 4 mg via INTRAVENOUS
  Filled 2023-05-05: qty 2

## 2023-05-05 MED ORDER — HYDROMORPHONE HCL 1 MG/ML IJ SOLN
1.0000 mg | Freq: Once | INTRAMUSCULAR | Status: AC
  Administered 2023-05-05: 1 mg via INTRAVENOUS
  Filled 2023-05-05: qty 1

## 2023-05-05 MED ORDER — HALOPERIDOL LACTATE 5 MG/ML IJ SOLN
2.0000 mg | Freq: Once | INTRAMUSCULAR | Status: AC
  Administered 2023-05-05: 2 mg via INTRAVENOUS
  Filled 2023-05-05: qty 1

## 2023-05-05 MED ORDER — SODIUM CHLORIDE 0.9 % IV BOLUS
2000.0000 mL | Freq: Once | INTRAVENOUS | Status: AC
  Administered 2023-05-05: 2000 mL via INTRAVENOUS

## 2023-05-05 MED ORDER — DIPHENHYDRAMINE HCL 50 MG/ML IJ SOLN
25.0000 mg | Freq: Once | INTRAMUSCULAR | Status: AC
  Administered 2023-05-05: 25 mg via INTRAVENOUS
  Filled 2023-05-05: qty 1

## 2023-05-05 NOTE — ED Triage Notes (Signed)
 Husband stated, she is having N/V/D started this morning with abdominal pain. Pt screaming in triage with pain

## 2023-05-05 NOTE — Sepsis Progress Note (Signed)
 Elink will follow per sepsis protocol.

## 2023-05-05 NOTE — ED Notes (Signed)
 Awaiting Zostrix cream from main pharmacy.

## 2023-05-05 NOTE — ED Notes (Signed)
 Awaiting patient from lobby.

## 2023-05-05 NOTE — Discharge Instructions (Addendum)
 Today you were seen for.  You may need abdominal pain with nausea and vomiting. I suspect this is likely due to cannabinoid hyperemesis syndrome.  Please refrain from [facility] use.  Thank you for letting us treat you today. After reviewing your labs and imaging, I feel you are safe to go home. Please follow up with your PCP in the next several days and provide them with your records from this visit. Return to the Emergency Room if pain becomes severe or symptoms worsen.

## 2023-05-05 NOTE — ED Notes (Signed)
 PA Mellody Dance at bedside

## 2023-05-05 NOTE — ED Provider Notes (Addendum)
 I provided a substantive portion of the care of this patient.  I personally made/approved the management plan for this patient and take responsibility for the patient management.  EKG Interpretation Date/Time:  Friday May 05 2023 11:58:21 EDT Ventricular Rate:  92 PR Interval:  227 QRS Duration:  102 QT Interval:  386 QTC Calculation: 478 R Axis:   34  Text Interpretation: Sinus rhythm Prolonged PR interval Consider left atrial enlargement Abnormal R-wave progression, early transition ST elevation, consider inferior injury no sig change from previous, slight increased rate Confirmed by Arby Barrette [phone]) on 05/05/2023 12:50:21 PM   Patient presents with complaint of severe abdominal pain and recurrent vomiting.  She reports she has not had this problem for over a year.  Previously sometimes it would happen every several months.  She reports she had had a lot of evaluation but there is never a specific cause.  Patient is screaming loudly and continuously, with kicking her feet and gnashing her teeth.  She has been treated with several doses of narcotics prior to my evaluation under the care of PA-C Heath.  Pain control has not been achieved with narcotic medication.  Patient is on chronic pain management with high-dose narcotics.  CT scan has been completed without acute findings except probable constipation.  Patient has a 25,000 white count but no source.  Initially sepsis protocol initiated.  However at this time there is no source identified.  With historical recurrent chronicity of symptoms, will treat with Haldol and capsaicin for cyclic vomiting type presentation.  I have personally reviewed the patient's EKG.  Normal intervals and no significant change from prior tracings.   Arby Barrette, MD 05/05/23 1305 Patient had resolution of symptoms with combination of Haldol and capsaicin.  UDS test positive for THC.  At this time I have highest suspicion for cannabinoids hyperemesis  syndrome.   Arby Barrette, MD 05/05/23 [phone]

## 2023-05-05 NOTE — ED Notes (Signed)
 Patient transported to CT

## 2023-05-05 NOTE — ED Notes (Signed)
 Phlebotomy at bedside collecting second blood cultures.

## 2023-05-05 NOTE — ED Provider Notes (Addendum)
 [facility] EMERGENCY DEPARTMENT AT [facility] Provider Note   CSN: [id number] Arrival date & time: 05/05/23  0750     History  Chief Complaint  Patient presents with   Abdominal Pain   Emesis   Nausea   Diarrhea    [name] is a 48 y.o. female.  Past medical history significant for generalized abdominal pain, cyclic vomiting syndrome, and GERD presents today for abdominal pain with nausea, vomiting, and diarrhea x 1 day.  Patient states that her pain is "all over".  Patient states that she has had her gallbladder and appendix removed previously.   Abdominal Pain Associated symptoms: diarrhea, nausea and vomiting   Emesis Associated symptoms: abdominal pain and diarrhea   Diarrhea Associated symptoms: abdominal pain and vomiting        Home Medications Prior to Admission medications   Medication Sig Start Date End Date Taking? Authorizing Provider  benzonatate (TESSALON) 200 MG capsule Take 1 capsule (200 mg total) by mouth 3 (three) times daily as needed for cough. 02/10/22   LampteyBritta Mccreedy, MD  docusate sodium (COLACE) 100 MG capsule Take 100 mg by mouth daily as needed for mild constipation.    [provider]  furosemide (LASIX) 40 MG tablet Take 40 mg by mouth 2 (two) times daily.    [provider]  gabapentin (NEURONTIN) 800 MG tablet Take 800 mg by mouth 3 (three) times daily.    [provider]  methocarbamol (ROBAXIN) 500 MG tablet Take 500 mg by mouth 3 (three) times daily as needed for muscle pain.    [provider]  morphine (MS CONTIN) 15 MG 12 hr tablet Take 15 mg by mouth 2 (two) times daily. (Morning and afternoon)    [provider]  morphine (MS CONTIN) 30 MG 12 hr tablet Take 30 mg by mouth at bedtime.    [provider]  Multiple Vitamins-Minerals (MULTIVITAMIN WITH MINERALS) tablet Take 1 tablet by mouth daily.    [provider]  Omega-3 1000 MG CAPS Take by mouth.     [provider]  ondansetron (ZOFRAN) 4 MG tablet Take 4 mg by mouth every 8 (eight) hours as needed for nausea or vomiting.    [provider]  ondansetron (ZOFRAN-ODT) 4 MG disintegrating tablet Take 1 tablet (4 mg total) by mouth every 8 (eight) hours as needed for nausea or vomiting. 05/05/23  Yes Dolphus Jenny, PA-C  oxyCODONE (ROXICODONE) 15 MG immediate release tablet Take 15 mg by mouth every 4 (four) hours as needed for breakthrough pain.    [provider]  pantoprazole (PROTONIX) 40 MG tablet Take 40 mg by mouth 2 (two) times daily.    [provider]  promethazine (PHENERGAN) 12.5 MG tablet Take 1 tablet (12.5 mg total) by mouth every 6 (six) hours as needed for nausea or vomiting. 12/12/21   Willy Eddy, MD  QUEtiapine (SEROQUEL) 200 MG tablet Take 400 mg by mouth at bedtime.    [provider]  REXULTI 0.5 MG TABS Take 1 tablet by mouth at bedtime. 02/23/21   [provider]      Allergies    Buspirone    Review of Systems   Review of Systems  Gastrointestinal:  Positive for abdominal pain, diarrhea, nausea and vomiting.    Physical Exam Updated Vital Signs BP 116/67   Pulse 85   Temp (!) 97.5 F (36.4 C) (Oral)   Resp 20   Ht 5'  8" (1.727 m)   Wt 99.8 kg   SpO2 99%   BMI 33.45 kg/m  Physical Exam Constitutional:      General: She is in acute distress.     Appearance: She is obese. She is not ill-appearing, toxic-appearing or diaphoretic.  HENT:     Head: Normocephalic and atraumatic.  Cardiovascular:     Rate and Rhythm: Normal rate.     Heart sounds: Normal heart sounds.  Pulmonary:     Effort: Pulmonary effort is normal.  Abdominal:     General: Bowel sounds are normal.     Palpations: Abdomen is soft.     Tenderness: There is generalized abdominal tenderness.  Skin:    General: Skin is warm and dry.     Capillary Refill: Capillary refill takes less than 2 seconds.  Neurological:     General:  No focal deficit present.     Mental Status: She is alert.    ED Results / Procedures / Treatments   Labs (all labs ordered are listed, but only abnormal results are displayed) Labs Reviewed  COMPREHENSIVE METABOLIC PANEL - Abnormal; Notable for the following components:      Result Value   CO2 19 (*)    Glucose, Bld 158 (*)    All other components within normal limits  URINALYSIS, ROUTINE W REFLEX MICROSCOPIC - Abnormal; Notable for the following components:   Hgb urine dipstick TRACE (*)    Protein, ur 30 (*)    All other components within normal limits  CBC - Abnormal; Notable for the following components:   WBC 25.6 (*)    All other components within normal limits  URINALYSIS, MICROSCOPIC (REFLEX) - Abnormal; Notable for the following components:   Bacteria, UA RARE (*)    All other components within normal limits  DIFFERENTIAL - Abnormal; Notable for the following components:   Neutro Abs 21.5 (*)    Abs Immature Granulocytes 0.19 (*)    All other components within normal limits  CBC - Abnormal; Notable for the following components:   WBC 25.0 (*)    All other components within normal limits  RAPID URINE DRUG SCREEN, HOSP PERFORMED - Abnormal; Notable for the following components:   Opiates POSITIVE (*)    Benzodiazepines POSITIVE (*)    Tetrahydrocannabinol POSITIVE (*)    All other components within normal limits  I-STAT CG4 LACTIC ACID, ED - Abnormal; Notable for the following components:   Lactic Acid, Venous 3.0 (*)    All other components within normal limits  CULTURE, BLOOD (ROUTINE X 2)  CULTURE, BLOOD (ROUTINE X 2)  LIPASE, BLOOD  HCG, SERUM, QUALITATIVE  PROTIME-INR  APTT  URINALYSIS, W/ REFLEX TO CULTURE (INFECTION SUSPECTED)  I-STAT CG4 LACTIC ACID, ED    EKG EKG Interpretation Date/Time:  Friday May 05 2023 11:58:21 EDT Ventricular Rate:  92 PR Interval:  227 QRS Duration:  102 QT Interval:  386 QTC Calculation: 478 R Axis:   34  Text  Interpretation: Sinus rhythm Prolonged PR interval Consider left atrial enlargement Abnormal R-wave progression, early transition ST elevation, consider inferior injury no sig change from previous, slight increased rate Confirmed by Arby Barrette [phone]) on 05/05/2023 12:50:21 PM  Radiology DG Chest Port 1 View Result Date: 05/05/2023 CLINICAL DATA:  Questionable sepsis - evaluate for abnormality. Abdominal pain. EXAM: PORTABLE CHEST 1 VIEW COMPARISON:  01/03/2018. FINDINGS: Low lung volume. Bilateral lung fields are clear. Bilateral costophrenic angles are clear. Normal cardio-mediastinal silhouette. No acute osseous abnormalities.  The soft tissues are within normal limits. There are multiple surgical staples scattered throughout the thorax. IMPRESSION: No active disease. Electronically Signed   By: Jules Schick M.D.   On: 05/05/2023 12:46   CT ABDOMEN PELVIS W CONTRAST Result Date: 05/05/2023 CLINICAL DATA:  Abdominal pain, nausea and vomiting, and diarrhea. EXAM: CT ABDOMEN AND PELVIS WITH CONTRAST TECHNIQUE: Multidetector CT imaging of the abdomen and pelvis was performed using the standard protocol following bolus administration of intravenous contrast. RADIATION DOSE REDUCTION: This exam was performed according to the departmental dose-optimization program which includes automated exposure control, adjustment of the mA and/or kV according to patient size and/or use of iterative reconstruction technique. CONTRAST:  75mL OMNIPAQUE IOHEXOL 350 MG/ML SOLN COMPARISON:  12/12/2021 FINDINGS: Lower Chest: No acute findings. Hepatobiliary: No suspicious hepatic masses identified. Prior cholecystectomy noted. No No evidence of biliary ductal dilatation. Mild pneumobilia is seen in the left hepatic lobe, consistent with prior sphincterotomy. Pancreas:  No mass or inflammatory changes. Spleen: Within normal limits in size and appearance. Adrenals/Urinary Tract: No suspicious masses identified. No evidence of  ureteral calculi or hydronephrosis. Unremarkable unopacified urinary bladder. Stomach/Bowel: No evidence of bowel obstruction, inflammatory process or abnormal fluid collections. Feces is seen within nondilated distal small bowel loops. This finding is nonspecific, but has been associated with abdominal pain and slow intestinal transit. Vascular/Lymphatic: No pathologically enlarged lymph nodes. No acute vascular findings. Reproductive:  No mass or other significant abnormality. Other: Postsurgical changes again seen in the lower anterior abdominal wall. No evidence of hernia. Musculoskeletal:  No suspicious bone lesions identified. IMPRESSION: No acute findings. Feces within nondilated distal small bowel loops. This finding (small bowel feces sign) is nonspecific, but has been associated with abdominal pain and slow intestinal transit. Electronically Signed   By: Danae Orleans M.D.   On: 05/05/2023 12:34    Procedures Procedures    Medications Ordered in ED Medications  lactated ringers infusion ( Intravenous New Bag/Given 05/05/23 1203)  fentaNYL (SUBLIMAZE) injection 50 mcg (50 mcg Intramuscular Given 05/05/23 0816)  ondansetron (ZOFRAN-ODT) disintegrating tablet 4 mg (4 mg Oral Given 05/05/23 0816)  morphine (PF) 4 MG/ML injection 4 mg (4 mg Intravenous Given 05/05/23 0924)  ondansetron (ZOFRAN) injection 4 mg (4 mg Intravenous Given 05/05/23 0922)  HYDROmorphone (DILAUDID) injection 1 mg (1 mg Intravenous Given 05/05/23 1057)  ceFEPIme (MAXIPIME) 2 g in sodium chloride 0.9 % 100 mL IVPB (0 g Intravenous Stopped 05/05/23 1248)  metroNIDAZOLE (FLAGYL) IVPB 500 mg (0 mg Intravenous Stopped 05/05/23 1351)  morphine (PF) 4 MG/ML injection 4 mg (4 mg Intravenous Given 05/05/23 1142)  iohexol (OMNIPAQUE) 350 MG/ML injection 75 mL (75 mLs Intravenous Contrast Given 05/05/23 1153)  diphenhydrAMINE (BENADRYL) injection 25 mg (25 mg Intravenous Given 05/05/23 1232)  haloperidol lactate (HALDOL) injection 2 mg (2  mg Intravenous Given 05/05/23 1254)  capsaicin (ZOSTRIX) 0.025 % cream ( Topical Given 05/05/23 1357)  sodium chloride 0.9 % bolus 2,000 mL (2,000 mLs Intravenous New Bag/Given 05/05/23 1357)    ED Course/ Medical Decision Making/ A&P                                 Medical Decision Making Amount and/or Complexity of Data Reviewed Labs: ordered. Radiology: ordered.  Risk OTC drugs. Prescription drug management.   This patient presents to the ED with chief complaint(s) of abdominal pain with nausea, vomiting, and diarrhea with pertinent past medical history of GERD,  generalized abdominal pain, cyclic vomiting syndrome which further complicates the presenting complaint. The complaint involves an extensive differential diagnosis and also carries with it a high risk of complications and morbidity.    The differential diagnosis includes appendicitis, pancreatitis, SBO, viral GI illness, volvulus, diverticulitis, cannabinoid induced hyperemesis  Additional history obtained: Additional history obtained from spouse Records reviewed Care Everywhere/External Records  ED Course and Reassessment: Code Sepsis activated Patient given fentanyl, morphine, and Dilaudid for pain without relief Patient given Haldol and finally having relief of pain Patient given 2 L normal saline  Independent labs interpretation:  The following labs were independently interpreted:  CBC: Leukocytosis at 25.6 CMP: Mildly decreased CO2 at 19 Lipase: 26 UA: Trace hemoglobin, 30 protein, rare bacteria hCG qualitative: Negative Lactic: 3.0, 2.8 Protime-INR: 13.7 and 1.0 APTT: 28 EKG: Sinus, prolonged PR UDS: Positive for opiates, benzodiazepines, and THC  Independent visualization of imaging: - I independently visualized the following imaging with scope of interpretation limited to determining acute life threatening conditions related to emergency care: CT abdomen pelvis with contrast, which revealed no acute  findings, feces sign Chest Xray: No active cardiopulmonary disease  Consultation: - Consulted or discussed management/test interpretation w/ external professional: None  Consideration for admission or further workup: Considered for admission or further workup however patient's vital signs, physical exam, labs, and imaging are reassuring.  Patient's symptoms likely due to cannabinoid hyperemesis syndrome.  Patient counseled to stop THC use.  Patient prescribed short course of Zofran for nausea outpatient.  Patient should follow-up with her primary care if their symptoms persist for further evaluation and treatment.        Final Clinical Impression(s) / ED Diagnoses Final diagnoses:  Cannabinoid hyperemesis syndrome  Generalized abdominal pain    Rx / DC Orders ED Discharge Orders          Ordered    ondansetron (ZOFRAN-ODT) 4 MG disintegrating tablet  Every 8 hours PRN        05/05/23 1433              Dolphus Jenny, PA-C 05/05/23 1517    Arby Barrette, MD 05/05/23 1519    Dolphus Jenny, PA-C 05/05/23 1542    Arby Barrette, MD 05/05/23 1550

## 2023-05-10 LAB — CULTURE, BLOOD (ROUTINE X 2)
Culture: NO GROWTH
Culture: NO GROWTH
Special Requests: ADEQUATE
Special Requests: ADEQUATE

## 2023-06-21 ENCOUNTER — Emergency Department: Payer: Self-pay

## 2023-06-21 ENCOUNTER — Emergency Department
Admission: EM | Admit: 2023-06-21 | Discharge: 2023-06-22 | Disposition: A | Discharge status: Home / Self Care | Payer: Self-pay | Attending: Emergency Medicine | Admitting: Emergency Medicine

## 2023-06-21 ENCOUNTER — Other Ambulatory Visit: Payer: Self-pay

## 2023-06-21 DIAGNOSIS — M5441 Lumbago with sciatica, right side: Secondary | ICD-10-CM | POA: Insufficient documentation

## 2023-06-21 DIAGNOSIS — M5442 Lumbago with sciatica, left side: Secondary | ICD-10-CM | POA: Insufficient documentation

## 2023-06-21 DIAGNOSIS — M79672 Pain in left foot: Secondary | ICD-10-CM | POA: Insufficient documentation

## 2023-06-21 DIAGNOSIS — Z853 Personal history of malignant neoplasm of breast: Secondary | ICD-10-CM | POA: Insufficient documentation

## 2023-06-21 DIAGNOSIS — G8929 Other chronic pain: Secondary | ICD-10-CM | POA: Insufficient documentation

## 2023-06-21 MED ORDER — OXYCODONE HCL 5 MG PO TABS
15.0000 mg | ORAL_TABLET | Freq: Once | ORAL | Status: AC
  Administered 2023-06-21: 15 mg via ORAL
  Filled 2023-06-21: qty 3

## 2023-06-21 MED ORDER — KETOROLAC TROMETHAMINE 30 MG/ML IJ SOLN
30.0000 mg | Freq: Once | INTRAMUSCULAR | Status: AC
  Administered 2023-06-21: 30 mg via INTRAMUSCULAR
  Filled 2023-06-21: qty 1

## 2023-06-21 NOTE — ED Provider Notes (Signed)
 [facility] Provider Note    Event Date/Time   First MD Initiated Contact with Patient 06/21/23 2304     (approximate)   History   Chief Complaint Back Pain   HPI  [name] is a 48 y.o. female with past medical history of breast cancer, bipolar disorder and chronic back pain who presents to the ED complaining of back and foot pain.  Patient reports that she has been dealing with a flareup in her lower back pain and sciatica since last week, when she felt pulling while attempting to bend over.  She describes pain shooting down her left leg, which is typical for her chronic back pain.  She has also had some numbness in the lower part of her left leg, but she denies any weakness in the leg.  She denies any saddle anesthesia or bowel or bladder incontinence.  She does report focal area of swelling to the arch of her left foot as well, denies any traumatic injury to the foot.  She denies any swelling in the more proximal portions of her leg, has not noticed any redness or wounds.  She takes MS Contin  and oxycodone  for pain but has not had significant relief.     Physical Exam   Triage Vital Signs: ED Triage Vitals  Encounter Vitals Group     BP 06/21/23 1925 129/89     Systolic BP Percentile --      Diastolic BP Percentile --      Pulse Rate 06/21/23 1925 92     Resp 06/21/23 1925 18     Temp 06/21/23 1925 98.3 F (36.8 C)     Temp src --      SpO2 06/21/23 1925 100 %     Weight 06/21/23 1930 224 lb (101.6 kg)     Height 06/21/23 1930 5\' 10"  (1.778 m)     Head Circumference --      Peak Flow --      Pain Score 06/21/23 1930 8     Pain Loc --      Pain Education --      Exclude from Growth Chart --     Most recent vital signs: Vitals:   06/21/23 1925  BP: 129/89  Pulse: 92  Resp: 18  Temp: 98.3 F (36.8 C)  SpO2: 100%    Constitutional: Alert and oriented. Eyes: Conjunctivae are normal. Head: Atraumatic. Nose: No  congestion/rhinnorhea. Mouth/Throat: Mucous membranes are moist.  Cardiovascular: Normal rate, regular rhythm. Grossly normal heart sounds.  2+ radial and DP pulses bilaterally. Respiratory: Normal respiratory effort.  No retractions. Lungs CTAB. Gastrointestinal: Soft and nontender. No distention. Musculoskeletal: Edema and tenderness noted to the area of the arch of the left foot with no erythema, warmth, or skin breakdown.  No edema or tenderness noted around the calf or proximal left leg. Neurologic:  Normal speech and language. No gross focal neurologic deficits are appreciated.    ED Results / Procedures / Treatments   Labs (all labs ordered are listed, but only abnormal results are displayed) Labs Reviewed - No data to display   RADIOLOGY Left foot x-ray reviewed and interpreted by me with no fracture or dislocation.  PROCEDURES:  Critical Care performed: No  Procedures   MEDICATIONS ORDERED IN ED: Medications  oxyCODONE  (Oxy IR/ROXICODONE ) immediate release tablet 15 mg (15 mg Oral Given 06/21/23 2246)  ketorolac  (TORADOL ) 30 MG/ML injection 30 mg (30 mg Intramuscular Given 06/21/23 2330)  IMPRESSION / MDM / ASSESSMENT AND PLAN / ED COURSE  I reviewed the triage vital signs and the nursing notes.                              48 y.o. female with past medical history of breast cancer, bipolar disorder, and chronic pain syndrome who presents to the ED complaining of acute on chronic lower back pain as well as painful swollen area to her left foot.  Patient's presentation is most consistent with acute complicated illness / injury requiring diagnostic workup.  Differential diagnosis includes, but is not limited to, lumbar radiculopathy, lumbar strain, cauda equina, DVT, arterial insufficiency, venous insufficiency, cellulitis, muscle strain, tendinopathy.  Patient nontoxic-appearing and in no acute distress, vital signs are unremarkable.  She is neurovascular intact to  her bilateral lower extremities, no findings concerning for cauda equina.  Back pain is a chronic issue for the patient and she has no neurologic deficits to necessitate emergent MRI or other imaging of her back, however she would benefit from outpatient follow-up with neurosurgery.  Her larger source of pain at this time appears to be the swollen area to the arch of her left foot.  No signs of infection at this area, suspect tendinopathy but will further assess with ultrasound to rule out DVT as well as x-ray.  Patient given her usual dose of oxycodone  and will give IM Toradol  in addition to this.  Ultrasound is negative for DVT, x-ray of left foot is negative for acute process.  Patient reports feeling better on reassessment with improvement in pain, she is appropriate for discharge home with outpatient podiatry follow-up.  She was counseled to return to the ED for new or worsening symptoms, patient agrees with plan.      FINAL CLINICAL IMPRESSION(S) / ED DIAGNOSES   Final diagnoses:  Left foot pain  Chronic bilateral low back pain with bilateral sciatica     Rx / DC Orders   ED Discharge Orders     None        Note:  This document was prepared using Dragon voice recognition software and may include unintentional dictation errors.   Twilla Galea, MD 06/22/23 [phone]

## 2023-06-21 NOTE — ED Triage Notes (Signed)
 Patient C/O lower back pain after injuring her back when she was bending over last week. Patient believes its sciatic pain, but now is experiencing numbness shooting down the left leg. Pain control has been an issue as patient is a cancer patient (right breast) and is on chronic pain meds (methocarbamal, gabapentin , oxycodone , and morphine  three times a day, with her last doses taken at 1130 am.

## 2023-07-01 ENCOUNTER — Ambulatory Visit: Admitting: Podiatry

## 2023-07-03 ENCOUNTER — Ambulatory Visit: Admitting: Podiatry

## 2023-07-03 ENCOUNTER — Telehealth: Payer: Self-pay | Admitting: Podiatry

## 2023-07-03 NOTE — Telephone Encounter (Signed)
 Pt left w/o being seen 5/19.Aaron Aas was not aware of self pay $200 cost

## 2023-07-21 ENCOUNTER — Ambulatory Visit: Payer: Self-pay | Admitting: Podiatry

## 2023-12-23 NOTE — H&P (Signed)
 GI PRE-PROCEDURE HISTORY AND PHYSICAL   [name] presents for the following scheduled GI procedure(s):  Colonoscopy  screening -  for average risk under age 47. The last colonoscopy procedure was performed:  2 years ago  requiring early repeat colonoscopy NOS poor prep on colonoscopy done in 2023  Scheduled Procedure(s): COLONOSCOPY, FLEXIBLE; DIAGNOSTIC, INCLUDING COLLECTION OF SPECIMEN(S) BY BRUSHING OR WASHING, WHEN PERFORMED (SEPARATE PROCEDURE).   The indication for the procedure(s) is Health maintenance examination [Z00.00].     I have reviewed the patient's history and physical and I have re-examined the patient prior to the procedure. There have been no significant recent changes in the patient's medical status.  History: Past Medical History:  Diagnosis Date  . Allergy Buspar  . Anemia 09/1998   After giving birth to 1st child  . Anxiety   . Bipolar depression (CMS/HHS-HCC) 2000   [facility]   . Cancer (CMS/HHS-HCC) 08/08/2017   Breast cancer  . Cholecystitis 2004   Removed gallbladder and stone retrieval from blocked duct  . Class 1 obesity due to excess calories with serious comorbidity and body mass index (BMI) of 33.0 to 33.9 in adult 04/13/2006   Overview:  Qualifier: Diagnosis of  By: Sharron Railing   . DDD (degenerative disc disease), lumbosacral 06/09/2021  . Depression   . Essential tremor 2017   propanolol [facility]  . Gallstones   . GERD (gastroesophageal reflux disease)   . Goiter 2000  . Malignant neoplasm of overlapping sites of right breast in female, estrogen receptor negative (CMS/HHS-HCC) 08/14/2017  . Mild obstructive sleep apnea 09/01/2021  . Motion sickness    sea sick  . Tobacco use 2000    Past Surgical History:  Procedure Laterality Date  . APPENDECTOMY  1989   [facility]   . CHOLECYSTECTOMY  [facility]   . TUBAL LIGATION  2003   [facility]   . BREAST BIOPSY  08/08/2017   Have had 2 biopsy for 2 separate tumors   . RECONSTRUCTION BREAST W/TISSUE EXPANDER Right 03/01/2018   Procedure: BREAST RECONSTRUCTION, IMMEDIATE OR DELAYED, WITH TISSUE EXPANDER, INCLUDING SUBSEQUENT EXPANSION;  Surgeon: Josie Glendia Ned, MD;  Location: [facility];  Service: Plastic Surgery;  Laterality: Right;  . MASTECTOMY SIMPLE Right 03/01/2018   Procedure: MASTECTOMY, SIMPLE, COMPLETE;  Surgeon: Aden Leita Barlow, MD;  Location: [facility];  Service: General Surgery;  Laterality: Right;  . BIOPSY/EXCISION LYMPH NODE AXILLARY Right 03/01/2018   Procedure: BIOPSY OR EXCISION OF LYMPH NODE(S); OPEN, DEEP AXILLARY NODE(S);  Surgeon: Aden Leita Barlow, MD;  Location: [facility];  Service: General Surgery;  Laterality: Right;  . LYMPH NODE BIOPSY  03/01/2018   Removed 3 lymph nodes from right underarm and had them biop  . REMOVAL TISSUE EXPANDER W/O INSERTION PROSTHESIS Right 07/06/2018   Procedure: REMOVAL OF TISSUE EXPANDER(S) WITHOUT INSERTION OF PROSTHESIS;  Surgeon: Josie Glendia Ned, MD;  Location: [facility];  Service: Plastic Surgery;  Laterality: Right;  . RECONSTRUCTION BREAST W/FREE FLAP Bilateral 07/06/2018   Procedure: BREAST RECONSTRUCTION WITH FREE FLAP (+MOD 22) - DIEP;  Surgeon: Josie Glendia Ned, MD;  Location: [facility];  Service: Plastic Surgery;  Laterality: Bilateral;  . INTRAVENOUS INJECTION TO TEST BLOOD FLOW IN FLAP/GRAFT Left 07/06/2018   Procedure: INTRAVENOUS INJECTION OF AGENT (EG, FLUORESCEIN) TO TEST VASCULAR FLOW IN FLAP OR GRAFT;  Surgeon: Josie Glendia Ned, MD;  Location: [facility];  Service: Plastic Surgery;  Laterality: Left;  .  MASTECTOMY SIMPLE Left 07/06/2018   Procedure: MASTECTOMY, SIMPLE, COMPLETE;  Surgeon: Aden Leita Barlow, MD;  Location: [facility];  Service: General Surgery;  Laterality: Left;  . REVISION OF RECONSTRUCTED BREAST Bilateral 11/08/2018   Procedure: REVISION OF  RECONSTRUCTED BREAST;  Surgeon: Josie Glendia Ned, MD;  [facility];  Service: Plastic Surgery;  Laterality: Bilateral;  . COMPLEX REPAIR ABDOMEN N/A 11/08/2018   Procedure: REPAIR, COMPLEX, TRUNK;  Surgeon: Josie Glendia Ned, MD;  [facility];  Service: Plastic Surgery;  Laterality: N/A;  . COMPLEX REPAIR ABDOMEN N/A 11/08/2018   Procedure: REPAIR, COMPLEX, TRUNK;  Surgeon: Josie Glendia Ned, MD;  [facility];  Service: Plastic Surgery;  Laterality: N/A;  . PR LAYR CLOS WND TRUNK,ARM,LEG 7.6-12.5 CM N/A 11/08/2018   Procedure: REPAIR, INTERMEDIATE, WOUND OF TRUNK;  Surgeon: Josie Glendia Ned, MD;  [facility];  Service: Plastic Surgery;  Laterality: N/A;  . RECONSTRUCTION NIPPLE &/OR AREOLA Bilateral 11/08/2018   Procedure: NIPPLE/AREOLA RECONSTRUCTION;  Surgeon: Josie Glendia Ned, MD;  [facility];  Service: Plastic Surgery;  Laterality: Bilateral;  . REMOVAL TUNNELED CENTRAL VENOUS DEVICE Left 11/08/2018   Procedure: REMOVAL OF TUNNELED CENTRAL VENOUS ACCESS DEVICE, WITH SUBCUTANEOUS PORT OR PUMP, CENTRAL OR PERIPHERAL INSERTION;  Surgeon: Josie Glendia Ned, MD;  [facility];  Service: Plastic Surgery;  Laterality: Left;  . REVISION OF RECONSTRUCTED BREAST Bilateral 05/30/2019   Procedure: REVISION OF RECONSTRUCTED BREAST;  Surgeon: Josie Glendia Ned, MD;  [facility];  Service: Plastic Surgery;  Laterality: Bilateral;  . COMPLEX REPAIR TRUNK N/A 05/30/2019   Procedure: REPAIR, COMPLEX, TRUNK; 2.6 CM TO 7.5 CM;  Surgeon: Josie Glendia Ned, MD;  [facility];  Service: Plastic Surgery;  Laterality: N/A;  . COMPLEX REPAIR TRUNK N/A 05/30/2019   Procedure: REPAIR, COMPLEX, TRUNK;  Surgeon: Josie Glendia Ned, MD;  [facility];  Service: Plastic Surgery;  Laterality: N/A;  . ESOPHAGOGASTRODOUDENOSCOPY W/BIOPSY N/A 07/28/2021   Procedure: ESOPHAGOGASTRODUODENOSCOPY, FLEXIBLE, TRANSORAL; WITH  BIOPSY, SINGLE OR MULTIPLE;  Surgeon: Glena Deward Naegeli, MD;  Location: [facility];  Service: Gastroenterology;  Laterality: N/A;  . SURVEILLANCE COLONOSCOPY N/A 07/28/2021   Procedure: SURVEILLANCE COLONOSCOPY, FLEXIBLE; DIAGNOSTIC, INCLUDING COLLECTION OF SPECIMEN(S) BY BRUSHING OR WASHING, WHEN PERFORMED (SEPARATE PROCEDURE);  Surgeon: Jowell, Paul Simon, MD;  Location: [facility];  Service: Gastroenterology;  Laterality: N/A;  . BREAST SURGERY  02/28/18,  07/06/18, 11/08/18   Mastectomy and reconstruction  . EXPLORATORY LAPAROTOMY  2021   Occ gastritis since chemo    Allergies Allergies  Allergen Reactions  . Buspirone Other (See Comments)    Depressed mood and tearful      Medications QUEtiapine, cholecalciferol, docusate, gabapentin , methocarbamoL , morphine , multivitamin, naloxone, omega-3s-dha-epa-fish oil, ondansetron , oxyCODONE , and tirzepatide  Recent Anticoagulant or Antiplatelet Use The patient is prescribed no previous anticoagulant or antiplatelet agents.  Prophylactic Antibiotics The patient does not require prophylactic antibiotics  Physical Examination BP 115/75   Pulse 102   Temp 36.9 C (98.4 F) (Oral)   Resp 15   Ht 177.8 cm (5' 10)   Wt (!) 103.4 kg (228 lb)   LMP 01/16/2018   SpO2 96%   BMI 32.71 kg/m  Mental Status: normal Airway: normal oropharyngeal airway and neck mobility Lungs: breathing comfortably Heart: normal Abdomen: soft, nondistended  ASSESSMENT AND PLAN  [name] has been evaluated prior to the planned procedure and deemed appropriate to undergo a Procedure(s): COLONOSCOPY, FLEXIBLE; DIAGNOSTIC, INCLUDING COLLECTION OF SPECIMEN(S) BY BRUSHING OR WASHING,  WHEN PERFORMED (SEPARATE PROCEDURE).

## 2023-12-23 NOTE — Unmapped External Note (Signed)
 Physician GI Post Procedure/Discharge Flowsheet for Colonoscopy Quality Improvement  *This note is meant for internal quality improvement purposes only.  Please refer to the complete colonoscopy procedure note and/or the pathology letter in the electronic medical record for full details of the procedure and recommended follow up intervals.*   1.  The colonoscopy was completed to the intended extent.  An ileocecal valve photo was captured An appendiceal orifice photo was captured A terminal ileum photo was captured.  2.  Current procedure bowel prep prescribed was MiraLAX Standard [110]. Inadequate preparation of the colon to follow recommended surveillance guidelines. Extended bowel prep recommended for next colonoscopy.  3.  Colonoscopy Polyp Specimen Collection: 3 polyps were removed, pending pathology. The colon polyp(s) size was less than 1cm.  4.  The recommended follow up interval for repeat colonoscopy is to be determined based on pathology results.  5.  The recommended Endoscopy location for future procedures; Schedule the patient's next GI procedure at an [facility].

## 2024-01-18 ENCOUNTER — Emergency Department (HOSPITAL_COMMUNITY)

## 2024-01-18 ENCOUNTER — Emergency Department (HOSPITAL_COMMUNITY)
Admission: EM | Admit: 2024-01-18 | Discharge: 2024-01-18 | Disposition: A | Discharge status: Home / Self Care | Attending: Emergency Medicine | Admitting: Emergency Medicine

## 2024-01-18 ENCOUNTER — Other Ambulatory Visit: Payer: Self-pay

## 2024-01-18 ENCOUNTER — Encounter (HOSPITAL_COMMUNITY): Payer: Self-pay

## 2024-01-18 DIAGNOSIS — R1013 Epigastric pain: Secondary | ICD-10-CM | POA: Diagnosis present

## 2024-01-18 DIAGNOSIS — R1084 Generalized abdominal pain: Secondary | ICD-10-CM | POA: Diagnosis not present

## 2024-01-18 DIAGNOSIS — R110 Nausea: Secondary | ICD-10-CM

## 2024-01-18 DIAGNOSIS — R112 Nausea with vomiting, unspecified: Secondary | ICD-10-CM | POA: Diagnosis not present

## 2024-01-18 DIAGNOSIS — R109 Unspecified abdominal pain: Secondary | ICD-10-CM

## 2024-01-18 LAB — COMPREHENSIVE METABOLIC PANEL WITH GFR
ALT: 25 U/L (ref 0–44)
AST: 27 U/L (ref 15–41)
Albumin: 4.1 g/dL (ref 3.5–5.0)
Alkaline Phosphatase: 86 U/L (ref 38–126)
Anion gap: 12 (ref 5–15)
BUN: 8 mg/dL (ref 6–20)
CO2: 21 mmol/L — ABNORMAL LOW (ref 22–32)
Calcium: 9.3 mg/dL (ref 8.9–10.3)
Chloride: 104 mmol/L (ref 98–111)
Creatinine, Ser: 0.76 mg/dL (ref 0.44–1.00)
GFR, Estimated: >60 mL/min (ref >60)
Glucose, Bld: 139 mg/dL — ABNORMAL HIGH (ref 70–99)
Potassium: 3.7 mmol/L (ref 3.5–5.1)
Sodium: 137 mmol/L (ref 135–145)
Total Bilirubin: 0.7 mg/dL (ref 0.0–1.2)
Total Protein: 7.2 g/dL (ref 6.5–8.1)

## 2024-01-18 LAB — I-STAT CHEM 8, ED
BUN: 8 mg/dL (ref 6–20)
Calcium, Ion: 1.09 mmol/L — ABNORMAL LOW (ref 1.15–1.40)
Chloride: 106 mmol/L (ref 98–111)
Creatinine, Ser: 0.8 mg/dL (ref 0.44–1.00)
Glucose, Bld: 137 mg/dL — ABNORMAL HIGH (ref 70–99)
HCT: 44 % (ref 36.0–46.0)
Hemoglobin: 15 g/dL (ref 12.0–15.0)
Potassium: 3.5 mmol/L (ref 3.5–5.1)
Sodium: 140 mmol/L (ref 135–145)
TCO2: 23 mmol/L (ref 22–32)

## 2024-01-18 LAB — CBC
HCT: 43.8 % (ref 36.0–46.0)
Hemoglobin: 15.8 g/dL — ABNORMAL HIGH (ref 12.0–15.0)
MCH: 34.2 pg — ABNORMAL HIGH (ref 26.0–34.0)
MCHC: 36.1 g/dL — ABNORMAL HIGH (ref 30.0–36.0)
MCV: 94.8 fL (ref 80.0–100.0)
Platelets: 275 K/uL (ref 150–400)
RBC: 4.62 MIL/uL (ref 3.87–5.11)
RDW: 14.4 % (ref 11.5–15.5)
WBC: 11.9 K/uL — ABNORMAL HIGH (ref 4.0–10.5)
nRBC: 0 % (ref 0.0–0.2)

## 2024-01-18 LAB — LIPASE, BLOOD: Lipase: 29 U/L (ref 11–51)

## 2024-01-18 LAB — I-STAT CG4 LACTIC ACID, ED: Lactic Acid, Venous: 1.6 mmol/L (ref 0.5–1.9)

## 2024-01-18 MED ORDER — IOHEXOL 350 MG/ML SOLN
100.0000 mL | Freq: Once | INTRAVENOUS | Status: AC | PRN
  Administered 2024-01-18: 100 mL via INTRAVENOUS

## 2024-01-18 MED ORDER — ONDANSETRON HCL 4 MG/2ML IJ SOLN
4.0000 mg | Freq: Once | INTRAMUSCULAR | Status: AC
  Administered 2024-01-18: 4 mg via INTRAVENOUS
  Filled 2024-01-18: qty 2

## 2024-01-18 MED ORDER — ONDANSETRON HCL 4 MG/2ML IJ SOLN
4.0000 mg | Freq: Once | INTRAMUSCULAR | Status: AC | PRN
  Administered 2024-01-18: 4 mg via INTRAVENOUS
  Filled 2024-01-18: qty 2

## 2024-01-18 MED ORDER — OXYCODONE HCL 5 MG PO TABS
15.0000 mg | ORAL_TABLET | Freq: Once | ORAL | Status: AC
  Administered 2024-01-18: 15 mg via ORAL
  Filled 2024-01-18: qty 3

## 2024-01-18 MED ORDER — MORPHINE SULFATE (PF) 2 MG/ML IV SOLN
4.0000 mg | Freq: Once | INTRAVENOUS | Status: AC
  Administered 2024-01-18: 4 mg via INTRAVENOUS

## 2024-01-18 MED ORDER — MORPHINE SULFATE (PF) 2 MG/ML IV SOLN
4.0000 mg | Freq: Once | INTRAVENOUS | Status: DC
  Filled 2024-01-18: qty 2

## 2024-01-18 MED ORDER — DROPERIDOL 2.5 MG/ML IJ SOLN
5.0000 mg | Freq: Once | INTRAMUSCULAR | Status: AC
  Administered 2024-01-18: 5 mg via INTRAMUSCULAR
  Filled 2024-01-18: qty 2

## 2024-01-18 NOTE — Discharge Instructions (Signed)
 Please follow-up with your primary doctors.  Return for fevers, chills, chest pain, shortness of breath, uncontrolled nausea or vomiting, severe pain or any new or worsening symptoms that are concerning to you.

## 2024-01-18 NOTE — ED Notes (Signed)
Pt pulled her IV out

## 2024-01-18 NOTE — ED Provider Triage Note (Cosign Needed Addendum)
 Emergency Medicine Provider Triage Evaluation Note  [name] , a 48 y.o. female  was evaluated in triage.  Pt complains of Fuhs abdominal pain since 0300 this morning.  Nausea and vomiting.  Husband and patient report this is similar to her other previous episodes.  They report they have seen multiple specialist and unsure what is causing this.  Difficult to obtain history given patient's thrashing around in the chair and screaming.  Review of Systems  Positive:  Negative:   Physical Exam  BP 108/88 (BP Location: Left Arm)   Pulse 87   Temp 98.2 F (36.8 C) (Oral)   Resp (!) 22   Ht 5' 10 (1.778 m)   Wt 99.8 kg   LMP  (LMP Unknown)   SpO2 99%   BMI 31.57 kg/m  Gen:   Patient thrashing around on chair, screaming Resp:  Normal effort  MSK:   Moves extremities without difficulty  Other:  Abdomen is soft. Diffuse tenderness.   Medical Decision Making  Medically screening exam initiated at 12:29 PM.  Appropriate orders placed.  TYREE VANDRUFF was informed that the remainder of the evaluation will be completed by another provider, this initial triage assessment does not replace that evaluation, and the importance of remaining in the ED until their evaluation is complete.  On previous chart evaluation, the patient had a very similar presentation that they thought was possibly cyclical vomiting syndrome given that she improved with these meds versus narcotics. Limited on what can be given in triage without monitoring. Will give morphine . I have ordered a CTA study of the abdomen and labs.   IV was placed in triage however the patient kept thrashing and removed the IV. IV team order has been placed given the patient has a restricted arm and difficult stick.    Bernis Ernst, PA-C 01/18/24 1232    Bernis Ernst, PA-C 01/18/24 1233

## 2024-01-18 NOTE — ED Triage Notes (Signed)
 Pt arrives screaming in pain.  She states that she began having severe abdominal pain at 3am.  Pt taken to triage.

## 2024-01-18 NOTE — ED Provider Notes (Signed)
 Clinical Course as of 01/18/24 1857  Thu Jan 18, 2024  1520 Patient resting comfortably at this time after IM droperidol .  Laboratory workup unremarkable overall.  Awaiting results of CTA abdomen pelvis and UA  I, Ozell Marine DO, am transitioning care of this patient to the oncoming provider pending CT abdomen pelvis UA, reevaluation and disposition [MP]  1524 Signout; h/;o breast CA, chronic belly pain/cyclical vomiting. Home oxy. Presented with abd/vomitting. Received droperidol . Pending CT scan and UA.  [TY]  1721 CT Angio Abd/Pel W and/or Wo Contrast IMPRESSION: 1. No acute findings in the abdomen/pelvis. No evidence of mesenteric ischemia. 2. Minimal aortic atherosclerosis. No evidence of aneurysm or dissection. 3. Minimal colonic diverticulosis without active inflammation.  Aortic Atherosclerosis (ICD10-I70.0).   Electronically Signed   By: Toribio Agreste M.D.   [TY]  [phone] Patient tolerating p.o. currently.  Pain has improved.  Asking for discharge.  Will discharge in stable condition as labs and imaging reassuring. [TY]    Clinical Course User Index [MP] Marine Ozell LABOR, DO [TY] Neysa Caron PARAS, DO      Neysa Caron PARAS, [place] 01/18/24 [phone]

## 2024-01-18 NOTE — ED Provider Notes (Signed)
 [place] EMERGENCY DEPARTMENT AT [facility] Provider Note   CSN: [id number] Arrival date & time: 01/18/24  1146     Patient presents with: Abdominal Pain   [name] is a 48 y.o. female.  With a history of breast cancer status post bilateral mastectomy, chronic abdominal pain, cyclic vomiting syndrome, who presents to the ED for abdominal pain.  Acute onset abdominal pain at 0300 this morning that woke her up from sleep.  Severe pain over the epigastric region with associated nausea vomiting. She is prescribed oxycodone  15 mg every 4 hours Phenergan  and Zofran  at home but has been able to keep these medications down since this morning.  Severe distress related to abdominal pain on my exam.  No fevers chills urinary symptoms or change in bowel habits    Abdominal Pain      Prior to Admission medications   Medication Sig Start Date End Date Taking? Authorizing Provider  benzonatate  (TESSALON ) 200 MG capsule Take 1 capsule (200 mg total) by mouth 3 (three) times daily as needed for cough. 02/10/22   LampteyAleene KIDD, MD  docusate sodium (COLACE) 100 MG capsule Take 100 mg by mouth daily as needed for mild constipation.    [provider]  furosemide (LASIX) 40 MG tablet Take 40 mg by mouth 2 (two) times daily.    [provider]  gabapentin  (NEURONTIN ) 800 MG tablet Take 800 mg by mouth 3 (three) times daily.    [provider]  methocarbamol  (ROBAXIN ) 500 MG tablet Take 500 mg by mouth 3 (three) times daily as needed for muscle pain.    [provider]  morphine  (MS CONTIN ) 15 MG 12 hr tablet Take 15 mg by mouth 2 (two) times daily. (Morning and afternoon)    [provider]  morphine  (MS CONTIN ) 30 MG 12 hr tablet Take 30 mg by mouth at bedtime.    [provider]  Multiple Vitamins-Minerals (MULTIVITAMIN WITH MINERALS) tablet Take 1 tablet by mouth daily.    [provider]  Omega-3 1000 MG CAPS Take by  mouth.    [provider]  ondansetron  (ZOFRAN ) 4 MG tablet Take 4 mg by mouth every 8 (eight) hours as needed for nausea or vomiting.    [provider]  ondansetron  (ZOFRAN -ODT) 4 MG disintegrating tablet Take 1 tablet (4 mg total) by mouth every 8 (eight) hours as needed for nausea or vomiting. 05/05/23   Francis Ileana SAILOR, PA-C  oxyCODONE  (ROXICODONE ) 15 MG immediate release tablet Take 15 mg by mouth every 4 (four) hours as needed for breakthrough pain.    [provider]  pantoprazole  (PROTONIX ) 40 MG tablet Take 40 mg by mouth 2 (two) times daily.    [provider]  promethazine  (PHENERGAN ) 12.5 MG tablet Take 1 tablet (12.5 mg total) by mouth every 6 (six) hours as needed for nausea or vomiting. 12/12/21   Lang Dover, MD  QUEtiapine (SEROQUEL) 200 MG tablet Take 400 mg by mouth at bedtime.    [provider]  REXULTI 0.5 MG TABS Take 1 tablet by mouth at bedtime. 02/23/21   [provider]    Allergies: Buspirone    Review of Systems  Gastrointestinal:  Positive for abdominal pain.    Updated Vital Signs BP 108/88 (BP Location: Left Arm)   Pulse 87   Temp 98.2 F (36.8 C) (Oral)   Resp (!) 22   Ht 5' 10 (1.778 m)   Wt 99.8  kg   LMP  (LMP Unknown)   SpO2 99%   BMI 31.57 kg/m   Physical Exam Vitals and nursing note reviewed.  Constitutional:      General: She is in acute distress.  HENT:     Head: Normocephalic and atraumatic.  Eyes:     Pupils: Pupils are equal, round, and reactive to light.  Cardiovascular:     Rate and Rhythm: Normal rate and regular rhythm.  Pulmonary:     Effort: Pulmonary effort is normal.     Breath sounds: Normal breath sounds.  Abdominal:     Palpations: Abdomen is soft.     Tenderness: There is generalized abdominal tenderness. There is no guarding or rebound.  Skin:    General: Skin is warm and dry.  Neurological:     Mental Status: She is alert.  Psychiatric:        Mood and  Affect: Mood normal.     (all labs ordered are listed, but only abnormal results are displayed) Labs Reviewed  COMPREHENSIVE METABOLIC PANEL WITH GFR - Abnormal; Notable for the following components:      Result Value   CO2 21 (*)    Glucose, Bld 139 (*)    All other components within normal limits  CBC - Abnormal; Notable for the following components:   WBC 11.9 (*)    Hemoglobin 15.8 (*)    MCH 34.2 (*)    MCHC 36.1 (*)    All other components within normal limits  I-STAT CHEM 8, ED - Abnormal; Notable for the following components:   Glucose, Bld 137 (*)    Calcium, Ion 1.09 (*)    All other components within normal limits  LIPASE, BLOOD  URINALYSIS, ROUTINE W REFLEX MICROSCOPIC  I-STAT CG4 LACTIC ACID, ED    EKG: None  Radiology: No results found.   Procedures   Medications Ordered in the ED  ondansetron  (ZOFRAN ) injection 4 mg (4 mg Intravenous Given 01/18/24 1203)  morphine  (PF) 2 MG/ML injection 4 mg (4 mg Intravenous Given 01/18/24 1219)  droperidol  (INAPSINE ) 2.5 MG/ML injection 5 mg (5 mg Intramuscular Given 01/18/24 1322)  iohexol  (OMNIPAQUE ) 350 MG/ML injection 100 mL (100 mLs Intravenous Contrast Given 01/18/24 1453)    Clinical Course as of 01/18/24 1532  Thu Jan 18, 2024  1520 Patient resting comfortably at this time after IM droperidol .  Laboratory workup unremarkable overall.  Awaiting results of CTA abdomen pelvis and UA  I, Ozell Marine DO, am transitioning care of this patient to the oncoming provider pending CT abdomen pelvis UA, reevaluation and disposition [MP]  1524 Signout; h/;o breast CA, chronic belly pain/cyclical vomiting. Home oxy. Presented with abd/vomitting. Received droperidol . Pending CT scan and UA.  [TY]    Clinical Course User Index [MP] Marine Ozell LABOR, DO [TY] Neysa Caron PARAS, DO                                 Medical Decision Making 48 year old female with history as above presented to the ED for acute onset abdominal pain  starting this morning.  Severe abdominal pain on my exam.  Does have a documented history of cyclic vomiting syndrome.  Received morphine  Zofran  in triage with little relief.  Due to her acute distress and agitation she accidentally removed the peripheral IV line.  She is a difficult stick so we will need some time to regain IV access.  In  order to provide her with relief of abdominal pain nausea vomiting more quickly will provide her with a dose of IM droperidol  and await laboratory workup and CT results.  Amount and/or Complexity of Data Reviewed Labs: ordered.  Risk Prescription drug management.        Final diagnoses:  Abdominal pain, unspecified abdominal location  Nausea    ED Discharge Orders     None          Pamella Ozell LABOR, DO 01/18/24 1532

## 2024-01-18 NOTE — ED Notes (Signed)
 Pt seen ambulating back to the room from restroom with a stable gait. Not able to obtain urine sample
